# Patient Record
Sex: Female | Born: 1965 | Race: White | Hispanic: No | Marital: Married | Smoking: Former smoker
Health system: Southern US, Community
[De-identification: ages and names within clinical notes are randomized; demographics above are authoritative.]

## PROBLEM LIST (undated history)

## (undated) DIAGNOSIS — M84373A Stress fracture, unspecified ankle, initial encounter for fracture: Secondary | ICD-10-CM

## (undated) DIAGNOSIS — F329 Major depressive disorder, single episode, unspecified: Secondary | ICD-10-CM

## (undated) DIAGNOSIS — K219 Gastro-esophageal reflux disease without esophagitis: Secondary | ICD-10-CM

## (undated) DIAGNOSIS — Z30430 Encounter for insertion of intrauterine contraceptive device: Secondary | ICD-10-CM

## (undated) DIAGNOSIS — J189 Pneumonia, unspecified organism: Secondary | ICD-10-CM

## (undated) DIAGNOSIS — F32A Depression, unspecified: Secondary | ICD-10-CM

## (undated) DIAGNOSIS — T7840XA Allergy, unspecified, initial encounter: Secondary | ICD-10-CM

## (undated) DIAGNOSIS — F419 Anxiety disorder, unspecified: Secondary | ICD-10-CM

## (undated) DIAGNOSIS — E785 Hyperlipidemia, unspecified: Secondary | ICD-10-CM

## (undated) HISTORY — DX: Encounter for insertion of intrauterine contraceptive device: Z30.430

## (undated) HISTORY — DX: Allergy, unspecified, initial encounter: T78.40XA

## (undated) HISTORY — DX: Gastro-esophageal reflux disease without esophagitis: K21.9

## (undated) HISTORY — DX: Anxiety disorder, unspecified: F41.9

## (undated) HISTORY — DX: Hyperlipidemia, unspecified: E78.5

---
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
## 1997-12-10 ENCOUNTER — Inpatient Hospital Stay (HOSPITAL_COMMUNITY): Admission: AD | Admit: 1997-12-10 | Discharge: 1997-12-10 | Payer: Self-pay | Admitting: Obstetrics and Gynecology

## 1998-02-25 ENCOUNTER — Inpatient Hospital Stay (HOSPITAL_COMMUNITY): Admission: AD | Admit: 1998-02-25 | Discharge: 1998-02-28 | Payer: Self-pay | Admitting: *Deleted

## 2000-06-02 ENCOUNTER — Encounter: Payer: Self-pay | Admitting: Emergency Medicine

## 2000-06-02 ENCOUNTER — Emergency Department (HOSPITAL_COMMUNITY): Admission: EM | Admit: 2000-06-02 | Discharge: 2000-06-02 | Payer: Self-pay | Admitting: Emergency Medicine

## 2000-06-15 ENCOUNTER — Encounter: Admission: RE | Admit: 2000-06-15 | Discharge: 2000-06-15 | Payer: Self-pay | Admitting: Orthopedic Surgery

## 2000-06-15 ENCOUNTER — Encounter: Payer: Self-pay | Admitting: Orthopedic Surgery

## 2001-01-12 ENCOUNTER — Encounter (INDEPENDENT_AMBULATORY_CARE_PROVIDER_SITE_OTHER): Payer: Self-pay | Admitting: *Deleted

## 2001-01-12 ENCOUNTER — Encounter: Payer: Self-pay | Admitting: Orthopedic Surgery

## 2001-01-13 ENCOUNTER — Inpatient Hospital Stay (HOSPITAL_COMMUNITY): Admission: RE | Admit: 2001-01-13 | Discharge: 2001-01-14 | Payer: Self-pay | Admitting: Orthopedic Surgery

## 2001-02-04 HISTORY — PX: MICRODISCECTOMY LUMBAR: SUR864

## 2002-04-11 ENCOUNTER — Other Ambulatory Visit: Admission: RE | Admit: 2002-04-11 | Discharge: 2002-04-11 | Payer: Self-pay | Admitting: *Deleted

## 2002-06-06 HISTORY — PX: SPINE SURGERY: SHX786

## 2003-05-13 ENCOUNTER — Other Ambulatory Visit: Admission: RE | Admit: 2003-05-13 | Discharge: 2003-05-13 | Payer: Self-pay | Admitting: *Deleted

## 2007-04-10 ENCOUNTER — Other Ambulatory Visit: Admission: RE | Admit: 2007-04-10 | Discharge: 2007-04-10 | Payer: Self-pay | Admitting: Obstetrics & Gynecology

## 2007-06-15 ENCOUNTER — Encounter: Admission: RE | Admit: 2007-06-15 | Discharge: 2007-06-15 | Payer: Self-pay | Admitting: Obstetrics & Gynecology

## 2008-06-06 HISTORY — PX: CERVICAL DISC ARTHROPLASTY: SHX587

## 2009-07-10 ENCOUNTER — Encounter: Admission: RE | Admit: 2009-07-10 | Discharge: 2009-07-10 | Payer: Self-pay | Admitting: Orthopedic Surgery

## 2009-08-12 ENCOUNTER — Inpatient Hospital Stay (HOSPITAL_COMMUNITY): Admission: RE | Admit: 2009-08-12 | Discharge: 2009-08-13 | Payer: Self-pay | Admitting: Orthopedic Surgery

## 2010-02-19 ENCOUNTER — Encounter: Admission: RE | Admit: 2010-02-19 | Discharge: 2010-02-19 | Payer: Self-pay | Admitting: Orthopedic Surgery

## 2010-05-26 ENCOUNTER — Encounter
Admission: RE | Admit: 2010-05-26 | Discharge: 2010-05-26 | Payer: Self-pay | Source: Home / Self Care | Attending: Obstetrics & Gynecology | Admitting: Obstetrics & Gynecology

## 2010-08-27 LAB — CBC
HCT: 41.9 % (ref 36.0–46.0)
Hemoglobin: 14.7 g/dL (ref 12.0–15.0)
WBC: 10.7 10*3/uL — ABNORMAL HIGH (ref 4.0–10.5)

## 2010-10-22 NOTE — Op Note (Signed)
[place]. [facility]  Patient:    Heather Cochran, Heather Cochran Visit Number: [id number] MRN: [id number]          Service Type: DSU Location: [phone] Attending Physician:  Marlowe Kays Page Proc. Date: 01/12/01 Admit Date:  01/12/2001                             Operative Report  PREOPERATIVE DIAGNOSIS:  Central herniated nucleus pulposus L5-S1.  POSTOPERATIVE DIAGNOSIS:  Central herniated nucleus pulposus L5-S1.  OPERATION:  Bilateral microdiskectomy L5-S1.  SURGEON:  Illene Labrador. [place], M.D.  ASSISTANT:  Georges Lynch. Darrelyn Hillock, M.D.  ANESTHESIA:  General.  INDICATIONS.  She has had at least a five-year history of fairly disabling back pain to the point now where her activities are markedly curtailed.  Plain x-rays demonstrate narrowing of the L5-S1 disk.  MRI demonstrates central disk herniation at L5-S1 with some protrusion over the sacrum.  Treatment options were discussed with her.  Because of the chronic disabling pain, she has elected to be here today for the microdiskectomy.  Risks and complications were discussed at length with her.  DESCRIPTION OF PROCEDURE:  Prophylactic antibiotics, satisfactory general anesthesia, prone position on the [place] frame.  Her back was prepped with DuraPrep.  With three spinal neeedles and a spinal x-ray, I tentatively identified the L5-S1 interspace.  I then completed draping in sterile filed, Ioban employed.  Vertical incision based on the initial x-ray, and the anatomy of L5-S1 space was confirmed with the sacrum identifiable, and I dissected the soft tissue off the lamina of L5 and sacrum bilaterally and placed a self-retaining McCullough retractor.  Working bilaterally, we removed a portion of the inferior part of the lamina of L5, and then with a microcuret, undermined superior portion of the sacrum, and with 2 and 3 mm Kerrison rongeurs, removed ligamentum flavum and bone laterally and did a  partial foraminotomy.  We then brought in the microscope.  Working first on the left side, I completed the decompression with the wider foraminotomy and removing bone laterally, but there was plenty of space for the S1 nerve root which was identified and moved medially.  The L5-S1 disk was noted to be bulging and was opened with a #15 knife blade after cauterizing a number of vessels with bipolar cautery.  The interspace was very narrow as suspected on the MRI.  A large amount of degenerated disk was removed, mainly with micro pituitaries, straight and angled up bite.  Also used Epstein curet and nerve hook, removing the disk material from over the sacrum.  When I thought we had obtained all disk material removable from the interspace, the wound was irrigated well, and I checked to be sure that wide foraminotomy had been performed and that there were no fragments beneath the S1 nerve root and that the L4-5 foramen was patent.  We then moved to the right where a similar procedure was done.  The S1 nerve root on the right was considerably more edematous than on the left, and a lot more disk material was removed from the right side which was in keeping with the MRI.  After we had removed all disk material here, we irrigated the wound well.  I checked to make sure the S1 nerve root was well decompressed in foramen and also foramen at 4-5 was decompressed.  I then placed Gelfoam over the interspace and around the S1 nerve root and dura  and then moved back to the left side once again where I once more entered the interspace, and basically no disk material was obtainable.   I then irrigated and applied Gelfoam as I had on the right.  Self-retaining retractors were removed.  She as given 30 mg of Toradol IV.  There was no unusual bleeding, and the fascia was closed with interrupted #1 Vicryl, subcutaneous tissue with 2-0 Vicryl and infiltrated with 0.5% Marcaine plain.  The skin was then closed  with staples. Betadine, Adaptic, and dry sterile dressing were applied.  She tolerated the procedure well and was returned to recovery in satisfactory condition with no known complications. Attending Physician:  Joaquin Courts DD:  01/12/01 TD:  01/13/01 Job: [id number]

## 2011-06-29 DIAGNOSIS — Z30430 Encounter for insertion of intrauterine contraceptive device: Secondary | ICD-10-CM

## 2011-06-29 HISTORY — DX: Encounter for insertion of intrauterine contraceptive device: Z30.430

## 2011-07-05 HISTORY — PX: INTRAUTERINE DEVICE INSERTION: SHX323

## 2012-06-13 ENCOUNTER — Other Ambulatory Visit: Payer: Self-pay | Admitting: Obstetrics & Gynecology

## 2012-06-13 DIAGNOSIS — Z1231 Encounter for screening mammogram for malignant neoplasm of breast: Secondary | ICD-10-CM

## 2012-06-15 ENCOUNTER — Ambulatory Visit
Admission: RE | Admit: 2012-06-15 | Discharge: 2012-06-15 | Disposition: A | Discharge status: Home / Self Care | Payer: 59 | Source: Ambulatory Visit | Attending: Obstetrics & Gynecology | Admitting: Obstetrics & Gynecology

## 2012-06-15 DIAGNOSIS — Z1231 Encounter for screening mammogram for malignant neoplasm of breast: Secondary | ICD-10-CM

## 2013-07-01 ENCOUNTER — Other Ambulatory Visit: Payer: Self-pay

## 2013-07-01 DIAGNOSIS — Z1231 Encounter for screening mammogram for malignant neoplasm of breast: Secondary | ICD-10-CM

## 2013-07-02 ENCOUNTER — Encounter: Payer: Self-pay | Admitting: Gynecology

## 2013-07-05 ENCOUNTER — Encounter: Payer: Self-pay | Admitting: Gynecology

## 2013-07-05 ENCOUNTER — Ambulatory Visit (INDEPENDENT_AMBULATORY_CARE_PROVIDER_SITE_OTHER): Payer: 59 | Admitting: Gynecology

## 2013-07-05 VITALS — BP 101/60 | HR 75 | Resp 18 | Ht 63.75 in | Wt 195.0 lb

## 2013-07-05 DIAGNOSIS — F329 Major depressive disorder, single episode, unspecified: Secondary | ICD-10-CM | POA: Insufficient documentation

## 2013-07-05 DIAGNOSIS — D259 Leiomyoma of uterus, unspecified: Secondary | ICD-10-CM

## 2013-07-05 DIAGNOSIS — D219 Benign neoplasm of connective and other soft tissue, unspecified: Secondary | ICD-10-CM | POA: Insufficient documentation

## 2013-07-05 DIAGNOSIS — Z0000 Encounter for general adult medical examination without abnormal findings: Secondary | ICD-10-CM

## 2013-07-05 DIAGNOSIS — F32A Depression, unspecified: Secondary | ICD-10-CM | POA: Insufficient documentation

## 2013-07-05 DIAGNOSIS — F3289 Other specified depressive episodes: Secondary | ICD-10-CM

## 2013-07-05 DIAGNOSIS — F411 Generalized anxiety disorder: Secondary | ICD-10-CM | POA: Insufficient documentation

## 2013-07-05 DIAGNOSIS — Z01419 Encounter for gynecological examination (general) (routine) without abnormal findings: Secondary | ICD-10-CM

## 2013-07-05 LAB — POCT URINALYSIS DIPSTICK
Blood, UA: 2
Leukocytes, UA: NEGATIVE
PH UA: 5
UROBILINOGEN UA: NEGATIVE

## 2013-07-05 MED ORDER — VENLAFAXINE HCL ER 37.5 MG PO CP24: 37.5000 mg | ORAL_CAPSULE | Freq: Every day | ORAL | Status: DC

## 2013-07-05 MED ORDER — BUPROPION HCL ER (XL) 300 MG PO TB24: 300.0000 mg | ORAL_TABLET | Freq: Every day | ORAL | Status: DC

## 2013-07-05 MED ORDER — BUPROPION HCL ER (XL) 150 MG PO TB24: 150.0000 mg | ORAL_TABLET | Freq: Every day | ORAL | Status: DC

## 2013-07-05 MED ORDER — VENLAFAXINE HCL ER 75 MG PO CP24: 75.0000 mg | ORAL_CAPSULE | Freq: Every day | ORAL | Status: DC

## 2013-07-05 NOTE — Progress Notes (Signed)
48 y.o. Married Caucasian female   [phone] here for annual exam. Pt is currently sexually active.  Pt was on wellbutrin and effexor for anxiety but her PCP is no long in [place],, so she ran out approx 10m ago.  Pt feels like she needs to be on something.  No hot flashes, no vaginal dryness.  No menses on IUD  Patient's last menstrual period was 11/02/2012.          Sexually active: yes  The current method of family planning is IUD (Mirena).    Exercising: no  The patient does not participate in regular exercise at present. Last pap: 06/06/11 NEG HR HPV Alcohol: 1 Monthly Tobacco: no BSE: no Mammogram: 06/15/12 Bi-Rads 1; scheduled in next few weeks   Urine: Blood 2   Health Maintenance  Topic Date Due  . Tetanus/tdap  06/06/2005  . Influenza Vaccine  01/04/2013  . Pap Smear  06/05/2014    Family History  Problem Relation Age of Onset  . Hypertension Mother   . Depression Father   . Kidney cancer Brother   . Depression Maternal Grandmother   . Colon cancer Maternal Grandmother   . Kidney cancer Maternal Grandmother     There are no active problems to display for this patient.   Past Medical History  Diagnosis Date  . Encounter for insertion of mirena IUD 06/29/11    Past Surgical History  Procedure Laterality Date  . Microdiscectomy lumbar  02/2001    L4-5    Allergies: Tetanus toxoids  Current Outpatient Prescriptions  Medication Sig Dispense Refill  . buPROPion (WELLBUTRIN XL) 300 MG 24 hr tablet Take 300 mg by mouth daily.      . Venlafaxine HCl (EFFEXOR XR PO) Take by mouth.       No current facility-administered medications for this visit.    ROS: Pertinent items are noted in HPI.  Exam:    BP 101/60  Pulse 75  Resp 18  Ht 5' 3.75" (1.619 m)  Wt 195 lb (88.451 kg)  BMI 33.74 kg/m2  LMP 11/02/2012 Weight change: @WEIGHTCHANGE @ Last 3 height recordings:  Ht Readings from Last 3 Encounters:  07/05/13 5' 3.75" (1.619 m)   General appearance: alert,  cooperative and appears stated age Head: Normocephalic, without obvious abnormality, atraumatic Neck: no adenopathy, no carotid bruit, no JVD, supple, symmetrical, trachea midline and thyroid not enlarged, symmetric, no tenderness/mass/nodules Lungs: clear to auscultation bilaterally Breasts: normal appearance, no masses or tenderness Heart: regular rate and rhythm, S1, S2 normal, no murmur, click, rub or gallop Abdomen: soft, non-tender; bowel sounds normal; no masses,  no organomegaly Extremities: extremities normal, atraumatic, no cyanosis or edema Skin: Skin color, texture, turgor normal. No rashes or lesions Lymph nodes: Cervical, supraclavicular, and axillary nodes normal. no inguinal nodes palpated Neurologic: Grossly normal   Pelvic: External genitalia:  no lesions              Urethra: normal appearing urethra with no masses, tenderness or lesions              Bartholins and Skenes: normal                 Vagina: normal appearing vagina with normal color and discharge, no lesions              Cervix: normal appearance and IUD string not seen or palpated              Pap taken: no  Bimanual Exam:  Uterus:  enlarged to 16 week's size, irregular                                      Adnexa:    Not palpable                                      Rectovaginal: Confirms                                      Anus:  normal sphincter tone, no lesions  A: well woman Contraceptive management-iud in place Fibroid uterus-stable     P: mammogram annual PAP not done Refill both effexor and wellbutrin will start at lower dose and bring back to previous level counseled on breast self exam, mammography screening, adequate intake of calcium and vitamin D, diet and exercise return annually or prn   An After Visit Summary was printed and given to the patient.

## 2013-07-05 NOTE — Patient Instructions (Signed)

## 2013-07-12 ENCOUNTER — Ambulatory Visit
Admission: RE | Admit: 2013-07-12 | Discharge: 2013-07-12 | Disposition: A | Discharge status: Home / Self Care | Payer: 59 | Source: Ambulatory Visit

## 2013-07-12 DIAGNOSIS — Z1231 Encounter for screening mammogram for malignant neoplasm of breast: Secondary | ICD-10-CM

## 2014-03-21 ENCOUNTER — Other Ambulatory Visit: Payer: Self-pay

## 2014-04-07 ENCOUNTER — Encounter: Payer: Self-pay | Admitting: Gynecology

## 2014-05-05 ENCOUNTER — Telehealth: Payer: Self-pay | Admitting: Gynecology

## 2014-05-05 NOTE — Telephone Encounter (Signed)
Left message regarding cancelled aex appt.

## 2014-06-09 ENCOUNTER — Other Ambulatory Visit: Payer: Self-pay

## 2014-06-09 DIAGNOSIS — Z1231 Encounter for screening mammogram for malignant neoplasm of breast: Secondary | ICD-10-CM

## 2014-07-07 ENCOUNTER — Ambulatory Visit: Payer: 59 | Admitting: Gynecology

## 2014-07-08 ENCOUNTER — Encounter: Payer: Self-pay | Admitting: Nurse Practitioner

## 2014-07-08 ENCOUNTER — Ambulatory Visit (INDEPENDENT_AMBULATORY_CARE_PROVIDER_SITE_OTHER): Payer: 59 | Admitting: Nurse Practitioner

## 2014-07-08 VITALS — BP 100/62 | HR 80 | Ht 63.75 in | Wt 186.0 lb

## 2014-07-08 DIAGNOSIS — Z0000 Encounter for general adult medical examination without abnormal findings: Secondary | ICD-10-CM

## 2014-07-08 DIAGNOSIS — K6389 Other specified diseases of intestine: Secondary | ICD-10-CM

## 2014-07-08 DIAGNOSIS — Z01419 Encounter for gynecological examination (general) (routine) without abnormal findings: Secondary | ICD-10-CM

## 2014-07-08 DIAGNOSIS — F32A Depression, unspecified: Secondary | ICD-10-CM

## 2014-07-08 DIAGNOSIS — F329 Major depressive disorder, single episode, unspecified: Secondary | ICD-10-CM

## 2014-07-08 DIAGNOSIS — K529 Noninfective gastroenteritis and colitis, unspecified: Secondary | ICD-10-CM

## 2014-07-08 LAB — POCT URINALYSIS DIPSTICK
Bilirubin, UA: NEGATIVE
Blood, UA: NEGATIVE
Glucose, UA: NEGATIVE
Ketones, UA: NEGATIVE
Leukocytes, UA: NEGATIVE
NITRITE UA: NEGATIVE
Protein, UA: NEGATIVE
Urobilinogen, UA: NEGATIVE
pH, UA: 7

## 2014-07-08 MED ORDER — VENLAFAXINE HCL ER 75 MG PO CP24: 75.0000 mg | ORAL_CAPSULE | Freq: Every day | ORAL | Status: DC

## 2014-07-08 MED ORDER — BUPROPION HCL ER (XL) 300 MG PO TB24: 300.0000 mg | ORAL_TABLET | Freq: Every day | ORAL | Status: DC

## 2014-07-08 NOTE — Progress Notes (Addendum)
Patient ID: Heather Cochran, female   DOB: May 10, 1966, 49 y.o.   MRN: [id number] 49 y.o. [id number] Married  Caucasian Fe here for annual exam.  Doing well with Mirena IUD with amenorrhea.  Occ. PMS symptoms. Having symptoms of IBS or other inflammatory GI disease with diarrhea for several months.  Sometimes associated with mucous.  Denies weight loss.  Would like to see GI.  Patient's last menstrual period was 07/05/2011.   Mirena placed 07/05/11.       Sexually active: Yes.    The current method of family planning is IUD (Mirena). Exercising: Yes.    treadmill 2-3 times per week Smoker:  no  Health Maintenance: Pap:  06/06/11, negative with neg HR HPV MMG:  07/12/13, Bi-Rads 1:  Negative apt. on Monday TDaP:  Allergy to tetanus, not a candidate for vaccine Labs:  HB:  13.8   Urine:  Negative    reports that she quit smoking about 22 years ago. She has never used smokeless tobacco. She reports that she drinks alcohol. She reports that she uses illicit drugs.  Past Medical History  Diagnosis Date  . Encounter for insertion of mirena IUD 06/29/11    Past Surgical History  Procedure Laterality Date  . Microdiscectomy lumbar  02/2001    L4-5  . Cervical disc arthroplasty  2010  . Cesarean section  1996  . Intrauterine device insertion  07/05/11    Mirena    Current Outpatient Prescriptions  Medication Sig Dispense Refill  . buPROPion (WELLBUTRIN XL) 300 MG 24 hr tablet Take 1 tablet (300 mg total) by mouth daily. 90 tablet 0  . venlafaxine XR (EFFEXOR XR) 75 MG 24 hr capsule Take 1 capsule (75 mg total) by mouth daily with breakfast. 90 capsule 0   No current facility-administered medications for this visit.    Family History  Problem Relation Age of Onset  . Hypertension Mother   . Depression Father   . Kidney cancer Brother   . Depression Maternal Grandmother   . Colon cancer Maternal Grandmother 69  . Kidney cancer Maternal Grandmother     ROS:  Pertinent items are noted in HPI.   Otherwise, a comprehensive ROS was negative.  Exam:   BP 100/62 mmHg  Pulse 80  Ht 5' 3.75" (1.619 m)  Wt 186 lb (84.369 kg)  BMI 32.19 kg/m2  LMP 07/05/2011 Height: 5' 3.75" (161.9 cm) Ht Readings from Last 3 Encounters:  07/08/14 5' 3.75" (1.619 m)  07/05/13 5' 3.75" (1.619 m)    General appearance: alert, cooperative and appears stated age Head: Normocephalic, without obvious abnormality, atraumatic Neck: no adenopathy, supple, symmetrical, trachea midline and thyroid normal to inspection and palpation Lungs: clear to auscultation bilaterally Breasts: normal appearance, no masses or tenderness Heart: regular rate and rhythm Abdomen: soft, non-tender; no masses,  no organomegaly Extremities: extremities normal, atraumatic, no cyanosis or edema Skin: Skin color, texture, turgor normal. No rashes or lesions Lymph nodes: Cervical, supraclavicular, and axillary nodes normal. No abnormal inguinal nodes palpated Neurologic: Grossly normal   Pelvic: External genitalia:  no lesions              Urethra:  normal appearing urethra with no masses, tenderness or lesions              Bartholin's and Skene's: normal                 Vagina: normal appearing vagina with normal color and discharge, no lesions  Cervix: anteverted.  IUD string was maybe seen at the os but was very short,              Pap taken: Yes.   Bimanual Exam:  Uterus:  enlarged, 12 weeks size              Adnexa: no mass, fullness, tenderness               Rectovaginal: Confirms               Anus:  normal sphincter tone, no lesions  Chaperone present:  no  A:  Well Woman with normal exam  Contraceptive management Mirena IUD 07/05/11  Fibroid uterus-stable  Degenerative disc disease    P:   Reviewed health and wellness pertinent to exam  Pap smear taken today  Mammogram is due and scheduled for Monday  Will get GI consult and follow to R/O inflammatory disease.  Counseled on breast self exam,  mammography screening, adequate intake of calcium and vitamin D, diet and exercise, Kegel's exercises return annually or prn  An After Visit Summary was printed and given to the patient.

## 2014-07-08 NOTE — Patient Instructions (Signed)

## 2014-07-09 LAB — HEMOGLOBIN, FINGERSTICK: Hemoglobin, fingerstick: 13.8 g/dL (ref 12.0–16.0)

## 2014-07-10 LAB — IPS PAP TEST WITH HPV

## 2014-07-13 NOTE — Progress Notes (Signed)
Encounter reviewed by Dr. Josefa Half.  If no recent ultrasound, will do pelvic ultrasound to evaluate uterus and IUD position.

## 2014-07-14 ENCOUNTER — Ambulatory Visit
Admission: RE | Admit: 2014-07-14 | Discharge: 2014-07-14 | Disposition: A | Discharge status: Home / Self Care | Payer: 59 | Source: Ambulatory Visit

## 2014-07-14 ENCOUNTER — Other Ambulatory Visit: Payer: Self-pay

## 2014-07-14 DIAGNOSIS — Z1231 Encounter for screening mammogram for malignant neoplasm of breast: Secondary | ICD-10-CM

## 2014-07-15 ENCOUNTER — Telehealth: Payer: Self-pay | Admitting: Nurse Practitioner

## 2014-07-15 NOTE — Telephone Encounter (Signed)
Left message to call [place] at [phone].  Need to advise patient of appointment scheduled for Feb 25th at 8:30am with Dr.Mann.

## 2014-07-15 NOTE — Telephone Encounter (Signed)
Spoke with patient. Advised patient of appointment with Dr.Mann on Feb 25th at 8:30am. Patient is agreeable to date and time. Provided address and phone number to Dr.Mann's office for reference.  Routing to provider for final review. Patient agreeable to disposition. Will close encounter

## 2014-07-15 NOTE — Telephone Encounter (Signed)
Pt checking on referral to GI doctor.

## 2014-07-17 ENCOUNTER — Telehealth: Payer: Self-pay | Admitting: Emergency Medicine

## 2014-07-17 DIAGNOSIS — D259 Leiomyoma of uterus, unspecified: Secondary | ICD-10-CM

## 2014-07-17 NOTE — Telephone Encounter (Signed)
Heather Cage, FNP  Karen Chafe, RN            Can you please call her and schedule PUS Looks like Dr. Quincy Simmonds wants to check the stability of fibroids.

## 2014-07-17 NOTE — Telephone Encounter (Signed)
Message left to return call to Arkin Imran at [phone].    

## 2014-07-17 NOTE — Telephone Encounter (Deleted)
-----   Message from [name], [place] sent at 07/15/2014  7:19 PM EST ----- Regarding: FW: let's do pelvic ultrasound to check uterus and IUD? Can you please call her and schedule PUS Looks like Dr. Quincy Simmonds wants to check the stability of fibroids. ----- Message -----    From: Odelia Gage, MD    Sent: 07/15/2014   4:51 PM      To: [name], FNP Subject: RE: let's do pelvic ultrasound to check uter#  Hi Patty,   I believe that it was at least 2 years since the patient had an ultrasound.    It is really reasonable to repeat the study.   Thank you!  Brook ----- Message -----    From: [name], FNP    Sent: 07/14/2014   8:39 AM      To: Brook E Amundson de Berton Lan, MD Subject: RE: let's do pelvic ultrasound to check uter#  Last PUS was done 3/13 by Dr. Charlies Constable and says IUD is seen centrally within the endometrial cavity and a large fundal fibroid is seen.  Pt. Is aware that we are having a hard time finding the strings - should I order another PUS?  Paper chart on your desk. ----- Message -----    From: Odelia Gage, MD    Sent: 07/13/2014  10:14 AM      To: [name], FNP Subject: let's do pelvic ultrasound to check uterus a#  Hi Patty,   I did not see a recent pelvic ultrasound on this patient or documentation about her IUD strings and position.  Note from Dr. Charlies Constable last year said the IUD strings were not visible.    Let's get a pelvic ultrasound to evaluate for uterine fibroids and IUD position?  Thanks!  Brook

## 2014-07-17 NOTE — Telephone Encounter (Signed)
Spoke with patient and message from [name], [place] discussed.Patient agreeable to appointment for ultrasound.   Scheduled for 08/21/14 due to patient work schedule. She would like to know out of pocket costs prior to confirming appointment. Advised patient would be contacted by insurance co-ordinator to discuss. Patient agreeable.   Sabrina, I have scheduled procedures. Can you call with precert?.   Will close. Routing to Dr. Quincy Simmonds and [name], FNP

## 2014-07-18 NOTE — Telephone Encounter (Signed)
Left message for patient to call back. Need to go over benefits for upcoming procedure.

## 2014-07-21 NOTE — Telephone Encounter (Signed)
Please keep in the work que until the patient is reached.   [place]

## 2014-07-22 NOTE — Telephone Encounter (Signed)
Patient has been reached.

## 2014-07-22 NOTE — Telephone Encounter (Signed)
Left message for patient to call back  

## 2014-07-22 NOTE — Telephone Encounter (Signed)
Telephone encounter documented in the account notes.

## 2014-08-15 ENCOUNTER — Telehealth: Payer: Self-pay | Admitting: Obstetrics and Gynecology

## 2014-08-15 NOTE — Telephone Encounter (Signed)
Patient called and cancelled her ultrasound appointment for 08/21/14 with Dr. Quincy Simmonds. She said, "There are other tests I have to have right now first." She will call back to reschedule at a later date.  Cc: Gay Filler for [employer]

## 2014-08-21 ENCOUNTER — Other Ambulatory Visit: Payer: 59

## 2014-08-21 ENCOUNTER — Other Ambulatory Visit: Payer: 59 | Admitting: Obstetrics and Gynecology

## 2014-08-26 NOTE — Telephone Encounter (Signed)
Follow-up call to patient. Left message to call back. Patient with known history of fibroids. Last AEX 07-08-14 with Edman Circle FNP 12 week size uterus. Previous AEX 07-05-13 with Dr Charlies Constable, 16 week size uterus.  Has IUD in place since 06/2011 and string visible, although short. Patient has amenorrhea with IUD in place and no other issues with fibroids.  Was referred to Dr Collene Mares for IBS issues by Chong Sicilian at most recent AEX.  Please advise on how long can delay PUS?

## 2014-08-27 NOTE — Telephone Encounter (Signed)
The reason for the pelvic ultrasound is to check the position of the IUD.  The strings were not seen vaginally with confidence at the patient's last office visit.  Without the ultrasound, we cannot tell the patient if the IUD is in the correct location and working well to give her contraception if this is needed. I do understand, however, that the patient is amenorrheic from her last office visit note.  We are just trying to help and at the same time can check the uterine fibroids.

## 2014-09-19 ENCOUNTER — Other Ambulatory Visit: Payer: Self-pay | Admitting: Nurse Practitioner

## 2014-09-19 NOTE — Telephone Encounter (Signed)
Please have Edman Circle review the plan for this patient for prescribing these medications.

## 2014-09-19 NOTE — Telephone Encounter (Signed)
Routed to PG 

## 2014-09-19 NOTE — Telephone Encounter (Signed)
Medication refill request: Wellbutrin and Effexor  Last AEX:  07/08/14 PG Next AEX: 07/10/15 PG Last MMG (if hormonal medication request): 07/14/14 [id number]:Neg Refill authorized: both 07/08/14 #[id number]. Today Please advise.   Routed to Dr. Quincy Simmonds

## 2014-09-22 NOTE — Telephone Encounter (Signed)
Left message to call Airi Copado at [phone]. 

## 2014-09-22 NOTE — Telephone Encounter (Signed)
Heather Cochran,  Please call this pt. About her Wellbutrin and Effexor.  She was previously on this med prior to Dr. Charlies Constable seeing her last year and PCP had moved v]from [facility].  She was given a refill then.  At AEX this year I saw her but did not give her a refill, assuming PCP was doing this.  Now that she has called for a refill she needs to know that we are no longer going to give this RX and has to be given through PCP.  A refill was sent by Dr. Zane Herald 1 only.  Will need to establish care or talk to new PCP if she has one about these me'ds.

## 2014-09-23 NOTE — Telephone Encounter (Signed)
Message left to return call to Heather Cochran at [phone].    

## 2014-09-30 NOTE — Telephone Encounter (Signed)
Left message to call Heather Cochran at [phone]. 

## 2014-09-30 NOTE — Telephone Encounter (Signed)
Follow up call to patient, left message to call back. 

## 2014-10-07 NOTE — Telephone Encounter (Signed)
Follow-up call to patient. Left message to call back, ask for triage nurse.

## 2014-10-21 NOTE — Telephone Encounter (Signed)
Left message to call Jacquelyne Quarry at [phone]. 

## 2014-10-23 NOTE — Telephone Encounter (Signed)
Call to patient. Mail box full, unable to leave message on cell number.  Left message to call back on home number. Patient is active on MyChart. Will send My Chart message. This is listed on DPR.   Routing to provider for final review. Patient agreeable to disposition. Will close encounter.

## 2014-10-31 LAB — HM COLONOSCOPY

## 2014-11-06 ENCOUNTER — Telehealth: Payer: Self-pay | Admitting: *Deleted

## 2014-11-06 NOTE — Telephone Encounter (Signed)
See previous phone messages. Multiple attempts to reach patient by phone and My Chart message was sent to follow up on scheduling recommended PUS. PUS was ordered by Dr Quincy Simmonds on 07-13-14 upon review of 07-08-14 OV with Edman Circle. Patient has not returned call and MyChart message was received that it was never read. Any further follow up needed?

## 2014-11-06 NOTE — Telephone Encounter (Signed)
I have been unable to reach this patient x3 regarding her medication refill. Lamont Snowball, RN has also attempted to reach this patient to discuss her care without return call. Mychart message also been sent that was not read by patient. Okay to close encounter?

## 2014-11-06 NOTE — Telephone Encounter (Signed)
I recommend sending a letter to the patient.  Triage has prepared a letter that would be appropriate to mail to her.   Thank you.

## 2014-11-10 ENCOUNTER — Encounter: Payer: Self-pay | Admitting: *Deleted

## 2014-11-10 NOTE — Telephone Encounter (Signed)
Letter signed by me and placed on your desk.

## 2014-11-10 NOTE — Telephone Encounter (Signed)
Letter to your office for review. 

## 2014-11-11 NOTE — Telephone Encounter (Signed)
Letter mailed certified and regular [place] mail. Encounter closed. Hollace Hayward.

## 2014-11-20 ENCOUNTER — Telehealth: Payer: Self-pay | Admitting: *Deleted

## 2014-11-20 NOTE — Telephone Encounter (Signed)
Dr Quincy Simmonds ordered PUS to assess IUD due to shorter strings seen by Heather Cochran at AEX on 07-08-14. fLetter mailed regarding recommendations for pelvic ultrasound on 11-10-14. No patient response.  Can we complete order until patient calls?

## 2014-11-20 NOTE — Telephone Encounter (Signed)
Yes.  Agree.  Order removed with explanation placed.

## 2014-11-20 NOTE — Telephone Encounter (Signed)
Encounter closed

## 2015-06-19 ENCOUNTER — Other Ambulatory Visit: Payer: Self-pay

## 2015-06-19 DIAGNOSIS — Z1231 Encounter for screening mammogram for malignant neoplasm of breast: Secondary | ICD-10-CM

## 2015-07-09 ENCOUNTER — Telehealth: Payer: Self-pay | Admitting: Nurse Practitioner

## 2015-07-09 NOTE — Telephone Encounter (Signed)
LEFT MESSAGE FOR APPT/RD

## 2015-07-10 ENCOUNTER — Encounter: Payer: Self-pay | Admitting: Nurse Practitioner

## 2015-07-10 ENCOUNTER — Ambulatory Visit (INDEPENDENT_AMBULATORY_CARE_PROVIDER_SITE_OTHER): Payer: 59 | Admitting: Nurse Practitioner

## 2015-07-10 VITALS — BP 104/66 | HR 60 | Resp 16 | Ht 63.75 in | Wt 144.0 lb

## 2015-07-10 DIAGNOSIS — Z01419 Encounter for gynecological examination (general) (routine) without abnormal findings: Secondary | ICD-10-CM | POA: Diagnosis not present

## 2015-07-10 DIAGNOSIS — Z0000 Encounter for general adult medical examination without abnormal findings: Secondary | ICD-10-CM | POA: Diagnosis not present

## 2015-07-10 LAB — POCT URINALYSIS DIPSTICK
BILIRUBIN UA: NEGATIVE
Blood, UA: NEGATIVE
GLUCOSE UA: NEGATIVE
KETONES UA: NEGATIVE
LEUKOCYTES UA: NEGATIVE
Nitrite, UA: NEGATIVE
PH UA: 7
Protein, UA: NEGATIVE
Urobilinogen, UA: NEGATIVE

## 2015-07-10 MED ORDER — BUPROPION HCL ER (XL) 300 MG PO TB24: 300.0000 mg | ORAL_TABLET | Freq: Every day | ORAL | Status: DC

## 2015-07-10 MED ORDER — VENLAFAXINE HCL ER 75 MG PO CP24: ORAL_CAPSULE | ORAL | Status: DC

## 2015-07-10 NOTE — Progress Notes (Signed)
50 y.o. [id number] Married  Caucasian Fe here for annual exam.  No menses on IUD needs changing Mirena IUD with Dr. Sabra Heck 06/2016.  She will call sometime in December to schedule.  She feels well without other  Health issues.  Patient's last menstrual period was 07/05/2011.          Sexually active: Yes.    The current method of family planning is IUD.    Exercising: Yes.    Walking Smoker:  no  Health Maintenance: Pap:  07/08/14 Neg HR HPV neg MMG: 07/14/14 BiRADS Category 1 Negative Colonoscopy: 04/16 Normal, 10 yr repeat TDaP:  Patient notes last tetanus was 1997 and had a reaction. HIV: will do today Labs: Hgb 14.3  UA Neg Ph 7.0   reports that she quit smoking about 23 years ago. She has never used smokeless tobacco. She reports that she drinks alcohol. She reports that she uses illicit drugs.  Past Medical History  Diagnosis Date  . Encounter for insertion of mirena IUD 06/29/11    Past Surgical History  Procedure Laterality Date  . Microdiscectomy lumbar  02/2001    L4-5  . Cervical disc arthroplasty  2010  . Cesarean section  1996  . Intrauterine device insertion  07/05/11    Mirena    Current Outpatient Prescriptions  Medication Sig Dispense Refill  . buPROPion (WELLBUTRIN XL) 300 MG 24 hr tablet TAKE 1 TABLET DAILY 30 tablet 0  . venlafaxine XR (EFFEXOR-XR) 75 MG 24 hr capsule TAKE 1 CAPSULE DAILY WITH BREAKFAST 30 capsule 0   No current facility-administered medications for this visit.    Family History  Problem Relation Age of Onset  . Hypertension Mother   . Depression Father   . Kidney cancer Brother   . Depression Maternal Grandmother   . Melanoma Maternal Grandmother 80    mets to colon  . Kidney cancer Maternal Grandmother     ROS:  Pertinent items are noted in HPI.  Otherwise, a comprehensive ROS was negative.  Exam:   BP 104/66 mmHg  Pulse 60  Resp 16  Ht 5' 3.75" (1.619 m)  Wt 144 lb (65.318 kg)  BMI 24.92 kg/m2  LMP 07/05/2011 Height: 5'  3.75" (161.9 cm) Ht Readings from Last 3 Encounters:  07/10/15 5' 3.75" (1.619 m)  07/08/14 5' 3.75" (1.619 m)  07/05/13 5' 3.75" (1.619 m)    General appearance: alert, cooperative and appears stated age Head: Normocephalic, without obvious abnormality, atraumatic Neck: no adenopathy, supple, symmetrical, trachea midline and thyroid normal to inspection and palpation Lungs: clear to auscultation bilaterally Breasts: normal appearance, no masses or tenderness Heart: regular rate and rhythm Abdomen: soft, non-tender; no masses,  no organomegaly Extremities: extremities normal, atraumatic, no cyanosis or edema Skin: Skin color, texture, turgor normal. No rashes or lesions Lymph nodes: Cervical, supraclavicular, and axillary nodes normal. No abnormal inguinal nodes palpated Neurologic: Grossly normal   Pelvic: External genitalia:  no lesions              Urethra:  normal appearing urethra with no masses, tenderness or lesions              Bartholin's and Skene's: normal                 Vagina: normal appearing vagina with normal color and discharge, no lesions              Cervix: anteverted IUD strings are visible  Pap taken: No. Bimanual Exam:  Uterus:  normal size, contour, position, consistency, mobility, non-tender and enlarged, 6-8 weeks size              Adnexa: no mass, fullness, tenderness               Rectovaginal: Confirms               Anus:  normal sphincter tone, no lesions  Chaperone present: no  A:  Well Woman with normal exam  Contraceptive management Mirena IUD 07/05/11 Fibroid uterus-stable Degenerative disc disease   P:   Reviewed health and wellness pertinent to exam  Pap smear as above  Mammogram is due now and will schedule  Will call for IUD change  Refill on Wellbutrin XL 300 mg daily and Effexor XR 75 mg daily for a year  Counseled on breast self exam, mammography screening, adequate intake of calcium  and vitamin D, diet and exercise, Kegel's exercises return annually or prn  An After Visit Summary was printed and given to the patient.

## 2015-07-10 NOTE — Patient Instructions (Signed)

## 2015-07-11 LAB — HIV ANTIBODY (ROUTINE TESTING W REFLEX): HIV: NONREACTIVE

## 2015-07-11 NOTE — Progress Notes (Signed)
Encounter reviewed by Dr. Brook Amundson C. Silva.  

## 2015-07-13 LAB — HEMOGLOBIN, FINGERSTICK: HEMOGLOBIN, FINGERSTICK: 14.3 g/dL (ref 12.0–16.0)

## 2015-07-20 ENCOUNTER — Ambulatory Visit
Admission: RE | Admit: 2015-07-20 | Discharge: 2015-07-20 | Disposition: A | Discharge status: Home / Self Care | Payer: 59 | Source: Ambulatory Visit

## 2015-07-20 DIAGNOSIS — Z1231 Encounter for screening mammogram for malignant neoplasm of breast: Secondary | ICD-10-CM

## 2015-08-17 ENCOUNTER — Inpatient Hospital Stay: Admission: RE | Admit: 2015-08-17 | Payer: Self-pay | Source: Ambulatory Visit

## 2016-04-25 ENCOUNTER — Telehealth: Payer: Self-pay | Admitting: Obstetrics & Gynecology

## 2016-04-25 DIAGNOSIS — Z30430 Encounter for insertion of intrauterine contraceptive device: Secondary | ICD-10-CM

## 2016-04-25 DIAGNOSIS — Z30432 Encounter for removal of intrauterine contraceptive device: Secondary | ICD-10-CM

## 2016-04-25 NOTE — Telephone Encounter (Signed)
Sending this to [place].  Can you verify benefits for pt to make sure she shouldn't do this before the end of the year.  Thanks.

## 2016-04-25 NOTE — Telephone Encounter (Signed)
Spoke with patient. Patient request appt for Mirena IUD removal and reinsertion. Mirena placed 07/05/11. Patient scheduled with Dr. Sabra Heck 06/14/15 at 3:30pm. Advised Motrin 800 mg with food and water one hour before procedure. Patient verbalizes understanding and is agreeable to date and time.  Orders placed for IUD removal and insertion.  Routing to provider for final review. Patient is agreeable to disposition. Will close encounter.  Cc: Kem Boroughs, NP; Theresia Lo

## 2016-04-25 NOTE — Telephone Encounter (Signed)
Patient says she needs to have her IUD replaced.

## 2016-04-27 ENCOUNTER — Telehealth: Payer: Self-pay | Admitting: Obstetrics & Gynecology

## 2016-04-27 NOTE — Telephone Encounter (Signed)
Spoke with patient regarding 2017 benefits for IUD removal and reinsertion. Patient understands current plan year benefits and is agreeable. Patient has elected to reschedule appointment to 05/20/16 with Dr Sabra Heck. Patient is aware of date, arrival time and cancellation policy. Patient has no further questions.  Routing to Dr Sabra Heck  cc: Glorianne Manchester

## 2016-04-27 NOTE — Telephone Encounter (Signed)
See telephone encounter dated 04/27/16 for follow-up.

## 2016-05-10 ENCOUNTER — Telehealth: Payer: Self-pay | Admitting: Obstetrics & Gynecology

## 2016-05-10 NOTE — Telephone Encounter (Signed)
Patient is scheduled for IUD Removal and reinsertion on 05/20/16.

## 2016-05-20 ENCOUNTER — Encounter: Payer: Self-pay | Admitting: Obstetrics & Gynecology

## 2016-05-20 ENCOUNTER — Ambulatory Visit (INDEPENDENT_AMBULATORY_CARE_PROVIDER_SITE_OTHER): Payer: 59 | Admitting: Obstetrics & Gynecology

## 2016-05-20 DIAGNOSIS — Z30430 Encounter for insertion of intrauterine contraceptive device: Secondary | ICD-10-CM | POA: Diagnosis not present

## 2016-05-20 DIAGNOSIS — Z30432 Encounter for removal of intrauterine contraceptive device: Secondary | ICD-10-CM

## 2016-05-20 MED ORDER — MISOPROSTOL 200 MCG PO TABS: ORAL_TABLET | ORAL | 0 refills | Status: DC

## 2016-05-21 NOTE — Progress Notes (Signed)
50 y.o. G39P2012 Married Caucasian female presents for removal of Mirena IUD and re-insertion of IUD.  Pt with hx of menorrhagia due to fibroid uterus.  She has been amenorrheic with the Mirena IUD and very happy with this.  As she is 50 without any evidence of peri-menopause from symptoms, pt is planning on having another Mirena IUD placed.  Pt's string has not been visible since after placement of Mirena IUD.  She did have a PUS confirming location but has not had one in several years.    Consent obtained.  Questions answered.  All questions answered prior to start of procedure.    LMP:  No LMP recorded. Patient is not currently having periods (Reason: IUD).  Patient Active Problem List   Diagnosis Date Noted  . Depression 07/05/2013  . Anxiety state, unspecified 07/05/2013  . Fibroid 07/05/2013   Past Medical History:  Diagnosis Date  . Encounter for insertion of mirena IUD 06/29/11   Current Outpatient Prescriptions on File Prior to Visit  Medication Sig Dispense Refill  . buPROPion (WELLBUTRIN XL) 300 MG 24 hr tablet Take 1 tablet (300 mg total) by mouth daily. 90 tablet 3  . venlafaxine XR (EFFEXOR-XR) 75 MG 24 hr capsule TAKE 1 CAPSULE DAILY WITH BREAKFAST 90 capsule 3   No current facility-administered medications on file prior to visit.    Tetanus toxoids  Review of Systems  All other systems reviewed and are negative.    Vitals:   05/20/16 1544  BP: 120/70  Pulse: 82  Resp: 14  Weight: 171 lb 3.2 oz (77.7 kg)  Height: 5\' 4"  (1.626 m)   Gen:  WNWF healthy female NAD Abdomen: soft, non-tender Groin:  no inguinal nodes palpated  Pelvic exam: Vulva:  normal female genitalia Vagina:  normal vagina Cervix:  Non-tender, Negative CMT, no lesions or redness, IUD strings not noted. Uterus:  Enlarged about 10 cm, mobile   Procedure:  Speculum reinserted.  Cervix visualized and cleansed with Betadine x 3.  Attempts made to grasp string and/or IUD with curette.  This was  unsuccessful.  As I am unsure I exact location of IUD within endometrium and I do not have ultrasound available today to give guidance, will reschedule pt to a day when ultrasound is available.  Pt is comfortable with plan.    Procedure ended.  Tenaculum removed.  Minimal spotting noted.  Speculum removed.    A: Removal of Attempted removed of Mirena IUD, unsuccessful Non-visualized IUD Uterine fibroids Amenorrhea with IUD  P:  Return next week for ultrasound guidance of IUD removal and replacement. Pt will use cytotec 292mcg pv, am and pm before procedure.  Rx to pharmacy.

## 2016-05-23 ENCOUNTER — Telehealth: Payer: Self-pay | Admitting: Obstetrics & Gynecology

## 2016-05-23 ENCOUNTER — Other Ambulatory Visit: Payer: Self-pay | Admitting: Obstetrics & Gynecology

## 2016-05-23 DIAGNOSIS — Z30433 Encounter for removal and reinsertion of intrauterine contraceptive device: Secondary | ICD-10-CM

## 2016-05-23 NOTE — Telephone Encounter (Signed)
Called patient to review benefits for a recommended procedure. Left Voicemail requesting a call back. °

## 2016-05-24 ENCOUNTER — Ambulatory Visit: Payer: 59

## 2016-05-24 ENCOUNTER — Ambulatory Visit (INDEPENDENT_AMBULATORY_CARE_PROVIDER_SITE_OTHER): Payer: 59 | Admitting: Obstetrics & Gynecology

## 2016-05-24 ENCOUNTER — Other Ambulatory Visit: Payer: Self-pay | Admitting: *Deleted

## 2016-05-24 DIAGNOSIS — D250 Submucous leiomyoma of uterus: Secondary | ICD-10-CM

## 2016-05-24 DIAGNOSIS — Z30432 Encounter for removal of intrauterine contraceptive device: Secondary | ICD-10-CM

## 2016-05-24 DIAGNOSIS — Z539 Procedure and treatment not carried out, unspecified reason: Secondary | ICD-10-CM

## 2016-05-24 DIAGNOSIS — D251 Intramural leiomyoma of uterus: Secondary | ICD-10-CM | POA: Diagnosis not present

## 2016-05-24 MED ORDER — DOXYCYCLINE HYCLATE 100 MG PO CAPS: 100.0000 mg | ORAL_CAPSULE | Freq: Two times a day (BID) | ORAL | 0 refills | Status: DC

## 2016-05-24 NOTE — Telephone Encounter (Signed)
Spoke with patient regarding benefit for ultrasound guided IUD removal/reinsertion. Patient understood and agreeable. Patient is scheduled today, 05/24/16  with Dr Sabra Heck. Patient aware of arrival date and time.  No further questions. Ok to close

## 2016-05-27 ENCOUNTER — Encounter: Payer: Self-pay | Admitting: Obstetrics & Gynecology

## 2016-05-27 NOTE — Progress Notes (Addendum)
Patient ID: Heather Cochran, female   DOB: 06-11-1965, 50 y.o.   MRN: [id number]  50 y.o. G34P2012 Married Caucasian female presents for removal of Mirena IUD and re-insertion of IUD.  She has hx of fibroids and menorrhagia.  Attempt was made at removal last week but was unsuccessful due to inability to see IUD string.  Today, attempt will be made with ultrasound guidance.  Procedure, risks and benefits have been reviewed.  She is here for procedure today.  Consent is obtained today.  All questions answered prior to start of procedure.    LMP:  No LMP recorded. Patient is not currently having periods (Reason: IUD).  Patient Active Problem List   Diagnosis Date Noted  . Depression 07/05/2013  . Anxiety state, unspecified 07/05/2013  . Fibroid 07/05/2013   Past Medical History:  Diagnosis Date  . Encounter for insertion of mirena IUD 06/29/11   Current Outpatient Prescriptions on File Prior to Visit  Medication Sig Dispense Refill  . buPROPion (WELLBUTRIN XL) 300 MG 24 hr tablet Take 1 tablet (300 mg total) by mouth daily. 90 tablet 3  . levonorgestrel (MIRENA) 20 MCG/24HR IUD 1 each by Intrauterine route once.    . misoprostol (CYTOTEC) 200 MCG tablet 1 tab pv pm and am before procedure 2 tablet 0  . venlafaxine XR (EFFEXOR-XR) 75 MG 24 hr capsule TAKE 1 CAPSULE DAILY WITH BREAKFAST 90 capsule 3   No current facility-administered medications on file prior to visit.    Tetanus toxoids  Review of Systems  All other systems reviewed and are negative.    Gen:  WNWF healthy female NAD Abdomen: soft, non-tender Groin:  no inguinal nodes palpated  Pelvic exam: Vulva:  normal female genitalia Vagina:  normal vagina Cervix:  Non-tender, Negative CMT, no lesions or redness. Uterus:  retroflexed and about 12 weeks in size   Procedure:  Speculum reinserted.  Cervix visualized and cleansed with Betadine x 3.  Paracervical block was not placed.  Single toothed tenaculum applied to anterior lip of  cervix without difficulty.  With ultrasound probe on abdomen, I could visualize the endometrial canal and location of IUD.  Milex dilator needed to dilate cervix.  Curette could be passed into endometrial cavity and was rotated but I could not get the curette to hold onto the IUD.  After several attempts, pt became hypotensive, diaphoretic, and nauseated.  Perforation did not occur however due to pt's reaction with instruments within endometrial cavity, procedure was ended.  Tenaculum removed from cervix with minimal bleeding and speculum removed.  Pt was then recovered in the procedure room in mild Trendelenburg positioning with cold clothes.  BP was 110/70.   Symptoms improved significantly once instruments were removed from vagina.  Will plan hysteroscopy with IUD removal and replacement of new IUD.  May need ultrasound guidance in OR as well.  This will be scheduled for pt.  She will return for discussion of procedure and risks and benefits as she was ready to leave when procedure ended.    A:  Inability to remove Mirena IUD due to inability to see IUD and size of endometrial cavity. H/O menorrhagia Fundal fibroid  P:   Hysteroscopy with IUD removal and replacement of new IUD will be planned in OR.  Doxycycline 100mg  bid x 3 days to pharmacy

## 2016-06-13 ENCOUNTER — Telehealth: Payer: Self-pay | Admitting: Obstetrics & Gynecology

## 2016-06-13 ENCOUNTER — Ambulatory Visit: Payer: Self-pay | Admitting: Obstetrics & Gynecology

## 2016-06-13 NOTE — Telephone Encounter (Signed)
Patient was last seen in office by Dr. Sabra Heck 05/24/16 -plan as seen below, routing to provider for review and advise?  P:         Hysteroscopy with IUD removal and replacement of new IUD will be planned in OR.      Doxycycline 100mg  bid x 3 days to pharmacy

## 2016-06-13 NOTE — Telephone Encounter (Signed)
Patient left message during lunch that she needs to schedule IUD removal with Dr Sabra Heck.

## 2016-06-13 NOTE — Telephone Encounter (Signed)
Return call to patient. Patient prefers surgery on Monday, Tuesday or Friday. Advised will begin precert and call patient back.

## 2016-06-14 ENCOUNTER — Other Ambulatory Visit: Payer: Self-pay | Admitting: Family Medicine

## 2016-06-14 DIAGNOSIS — Z1231 Encounter for screening mammogram for malignant neoplasm of breast: Secondary | ICD-10-CM

## 2016-06-17 NOTE — Telephone Encounter (Signed)
Call to patient. Advised hysteroscopic removal of IUD with reinsertion under ultrasound guidance scheduled for 07-04-16 at 20 at [facility]. Surgery instruction sheet reviewed and printed copy will be mailed.  Advised Dr Sabra Heck will review and we will call her back if any additional instructions prior to procedure.  Routing to provider for final review. Patient agreeable to disposition. Will close encounter.

## 2016-06-17 NOTE — Telephone Encounter (Signed)
Case request for 07-04-16 sent to central scheduling.

## 2016-06-21 ENCOUNTER — Telehealth: Payer: Self-pay | Admitting: *Deleted

## 2016-06-21 ENCOUNTER — Telehealth: Payer: Self-pay | Admitting: Obstetrics & Gynecology

## 2016-06-21 ENCOUNTER — Encounter (HOSPITAL_COMMUNITY): Payer: Self-pay | Admitting: *Deleted

## 2016-06-21 MED ORDER — MISOPROSTOL 200 MCG PO TABS: ORAL_TABLET | ORAL | 0 refills | Status: DC

## 2016-06-21 MED FILL — MIRENA SYSTEM: 20 | 1 days supply | Qty: 1 | Fill #0

## 2016-06-21 NOTE — Telephone Encounter (Signed)
[facility] called and left a message at lunch on our answering machine to let Gay Filler know the Mirena prescription went through at no charge.  They'd like to know whether the patient will pick it up or whether they can send it to our office.

## 2016-06-21 NOTE — Telephone Encounter (Signed)
Message from [facility] out patient pharmacy and Mirena IUD has been dispensed. Will be sent to our office for patient.   Call to patient and notified of IUD status being sent from [facility] out patient pharmacy.   Routing to provider for final review. Patient agreeable to disposition. Will close encounter.

## 2016-06-21 NOTE — Telephone Encounter (Signed)
Call to patient. Advised of Dr Ammie Ferrier recommendation for Cytotec prior to surgery. Also updated on IUD through speciality pharmacy which has been unsuccessful so far. This is now being sent to [facility] out patient pharmacy and she may be receiving call from them.   Routing to provider for final review. Patient agreeable to disposition. Will close encounter.

## 2016-06-24 ENCOUNTER — Other Ambulatory Visit: Payer: Self-pay | Admitting: Obstetrics & Gynecology

## 2016-06-27 ENCOUNTER — Encounter (HOSPITAL_COMMUNITY): Payer: Self-pay | Admitting: *Deleted

## 2016-06-28 ENCOUNTER — Telehealth: Payer: Self-pay | Admitting: *Deleted

## 2016-06-28 MED ORDER — MISOPROSTOL 200 MCG PO TABS: ORAL_TABLET | ORAL | 0 refills | Status: DC

## 2016-06-28 NOTE — Telephone Encounter (Signed)
Return call to patient. Updates provided. Appointment for annual moved to 08-08-16 with post-op appointment. Patient appreciative.   Routing to provider for final review. Patient agreeable to disposition. Will close encounter.

## 2016-06-28 NOTE — Telephone Encounter (Signed)
Patient returned call

## 2016-06-28 NOTE — Telephone Encounter (Signed)
Call to patient to provide update prior to surgery. IUD has been received here in office for surgery. Patient can combine scheduled annual exam (07-11-17 with post op on 08-08-16) if desired. Left message to call back.

## 2016-06-28 NOTE — Telephone Encounter (Signed)
Patient calling in regard to medication for surgery. Went to pick up medication which is not available. Per Epic, medication was sent on 06-21-16 to mail order pharmacy. Rx changed to local pharmacy as requested.  Routing to provider for final review. Patient agreeable to disposition. Will close encounter.

## 2016-07-04 ENCOUNTER — Ambulatory Visit (HOSPITAL_COMMUNITY): Payer: 59 | Admitting: Anesthesiology

## 2016-07-04 ENCOUNTER — Encounter (HOSPITAL_COMMUNITY)
Admission: RE | Disposition: A | Discharge status: Home / Self Care | Payer: Self-pay | Source: Ambulatory Visit | Attending: Obstetrics & Gynecology

## 2016-07-04 ENCOUNTER — Encounter (HOSPITAL_COMMUNITY): Payer: Self-pay

## 2016-07-04 ENCOUNTER — Ambulatory Visit (HOSPITAL_COMMUNITY)
Admission: RE | Admit: 2016-07-04 | Discharge: 2016-07-04 | Disposition: A | Discharge status: Home / Self Care | Payer: 59 | Source: Ambulatory Visit | Attending: Obstetrics & Gynecology | Admitting: Obstetrics & Gynecology

## 2016-07-04 ENCOUNTER — Ambulatory Visit (HOSPITAL_COMMUNITY): Payer: 59

## 2016-07-04 DIAGNOSIS — Z87891 Personal history of nicotine dependence: Secondary | ICD-10-CM | POA: Insufficient documentation

## 2016-07-04 DIAGNOSIS — Z79899 Other long term (current) drug therapy: Secondary | ICD-10-CM | POA: Diagnosis not present

## 2016-07-04 DIAGNOSIS — Z975 Presence of (intrauterine) contraceptive device: Secondary | ICD-10-CM

## 2016-07-04 DIAGNOSIS — Z30430 Encounter for insertion of intrauterine contraceptive device: Secondary | ICD-10-CM

## 2016-07-04 DIAGNOSIS — F329 Major depressive disorder, single episode, unspecified: Secondary | ICD-10-CM | POA: Insufficient documentation

## 2016-07-04 DIAGNOSIS — T8332XA Displacement of intrauterine contraceptive device, initial encounter: Secondary | ICD-10-CM | POA: Diagnosis not present

## 2016-07-04 DIAGNOSIS — N920 Excessive and frequent menstruation with regular cycle: Secondary | ICD-10-CM | POA: Insufficient documentation

## 2016-07-04 DIAGNOSIS — D251 Intramural leiomyoma of uterus: Secondary | ICD-10-CM

## 2016-07-04 DIAGNOSIS — Z30432 Encounter for removal of intrauterine contraceptive device: Secondary | ICD-10-CM | POA: Insufficient documentation

## 2016-07-04 DIAGNOSIS — D259 Leiomyoma of uterus, unspecified: Secondary | ICD-10-CM | POA: Insufficient documentation

## 2016-07-04 HISTORY — DX: Major depressive disorder, single episode, unspecified: F32.9

## 2016-07-04 HISTORY — DX: Pneumonia, unspecified organism: J18.9

## 2016-07-04 HISTORY — PX: INTRAUTERINE DEVICE (IUD) INSERTION: SHX5877

## 2016-07-04 HISTORY — DX: Depression, unspecified: F32.A

## 2016-07-04 HISTORY — PX: HYSTEROSCOPY: SHX211

## 2016-07-04 LAB — PREGNANCY, URINE: Preg Test, Ur: NEGATIVE

## 2016-07-04 SURGERY — HYSTEROSCOPY
Anesthesia: General | Site: Vagina

## 2016-07-04 MED ORDER — MIDAZOLAM HCL 2 MG/2ML IJ SOLN
INTRAMUSCULAR | Status: AC
  Filled 2016-07-04: qty 2

## 2016-07-04 MED ORDER — FENTANYL CITRATE (PF) 100 MCG/2ML IJ SOLN
INTRAMUSCULAR | Status: AC
  Filled 2016-07-04: qty 2

## 2016-07-04 MED ORDER — MIDAZOLAM HCL 5 MG/5ML IJ SOLN
INTRAMUSCULAR | Status: DC | PRN
  Administered 2016-07-04: 2 mg via INTRAVENOUS

## 2016-07-04 MED ORDER — SUCCINYLCHOLINE CHLORIDE 200 MG/10ML IV SOSY
PREFILLED_SYRINGE | INTRAVENOUS | Status: AC
  Filled 2016-07-04: qty 10

## 2016-07-04 MED ORDER — DEXAMETHASONE SODIUM PHOSPHATE 10 MG/ML IJ SOLN
INTRAMUSCULAR | Status: AC
  Filled 2016-07-04: qty 1

## 2016-07-04 MED ORDER — FENTANYL CITRATE (PF) 100 MCG/2ML IJ SOLN
INTRAMUSCULAR | Status: DC | PRN
  Administered 2016-07-04: 25 ug via INTRAVENOUS
  Administered 2016-07-04: 100 ug via INTRAVENOUS

## 2016-07-04 MED ORDER — SCOPOLAMINE 1 MG/3DAYS TD PT72
1.0000 | MEDICATED_PATCH | Freq: Once | TRANSDERMAL | Status: DC
  Administered 2016-07-04: 1.5 mg via TRANSDERMAL

## 2016-07-04 MED ORDER — ONDANSETRON HCL 4 MG/2ML IJ SOLN
INTRAMUSCULAR | Status: DC | PRN
  Administered 2016-07-04: 4 mg via INTRAVENOUS

## 2016-07-04 MED ORDER — PROPOFOL 10 MG/ML IV BOLUS
INTRAVENOUS | Status: DC | PRN
  Administered 2016-07-04: 200 mg via INTRAVENOUS

## 2016-07-04 MED ORDER — LIDOCAINE HCL 1 % IJ SOLN
INTRAMUSCULAR | Status: AC
  Filled 2016-07-04: qty 20

## 2016-07-04 MED ORDER — IBUPROFEN 800 MG PO TABS: 800.0000 mg | ORAL_TABLET | Freq: Three times a day (TID) | ORAL | 0 refills | Status: DC | PRN

## 2016-07-04 MED ORDER — NEOSTIGMINE METHYLSULFATE 10 MG/10ML IV SOLN
INTRAVENOUS | Status: AC
Start: 2016-07-04 — End: 2016-07-04
  Filled 2016-07-04: qty 1

## 2016-07-04 MED ORDER — ONDANSETRON HCL 4 MG/2ML IJ SOLN
INTRAMUSCULAR | Status: AC
  Filled 2016-07-04: qty 2

## 2016-07-04 MED ORDER — KETOROLAC TROMETHAMINE 30 MG/ML IJ SOLN
INTRAMUSCULAR | Status: DC | PRN
  Administered 2016-07-04: 30 mg via INTRAVENOUS
  Administered 2016-07-04: 30 mg via INTRAMUSCULAR

## 2016-07-04 MED ORDER — SCOPOLAMINE 1 MG/3DAYS TD PT72
MEDICATED_PATCH | TRANSDERMAL | Status: AC
  Administered 2016-07-04: 1.5 mg via TRANSDERMAL
  Filled 2016-07-04: qty 1

## 2016-07-04 MED ORDER — LACTATED RINGERS IV SOLN
INTRAVENOUS | Status: DC
  Administered 2016-07-04: 125 mL/h via INTRAVENOUS
  Administered 2016-07-04: 10:00:00 via INTRAVENOUS

## 2016-07-04 MED ORDER — SODIUM CHLORIDE 0.9 % IR SOLN
Status: DC | PRN
  Administered 2016-07-04: 3000 mL

## 2016-07-04 MED ORDER — LIDOCAINE HCL (CARDIAC) 20 MG/ML IV SOLN
INTRAVENOUS | Status: AC
  Filled 2016-07-04: qty 5

## 2016-07-04 MED ORDER — DEXAMETHASONE SODIUM PHOSPHATE 10 MG/ML IJ SOLN
INTRAMUSCULAR | Status: DC | PRN
  Administered 2016-07-04: 10 mg via INTRAVENOUS

## 2016-07-04 MED ORDER — LIDOCAINE HCL 1 % IJ SOLN
INTRAMUSCULAR | Status: DC | PRN
  Administered 2016-07-04: 10 mL

## 2016-07-04 MED ORDER — PROPOFOL 10 MG/ML IV BOLUS
INTRAVENOUS | Status: AC
  Filled 2016-07-04: qty 20

## 2016-07-04 MED ORDER — ROCURONIUM BROMIDE 100 MG/10ML IV SOLN
INTRAVENOUS | Status: AC
  Filled 2016-07-04: qty 1

## 2016-07-04 MED ORDER — HYDROCODONE-ACETAMINOPHEN 5-325 MG PO TABS: 1.0000 | ORAL_TABLET | Freq: Four times a day (QID) | ORAL | 0 refills | Status: DC | PRN

## 2016-07-04 MED ORDER — GLYCOPYRROLATE 0.2 MG/ML IJ SOLN
INTRAMUSCULAR | Status: AC
  Filled 2016-07-04: qty 3

## 2016-07-04 SURGICAL SUPPLY — 23 items
BIPOLAR CUTTING LOOP 21FR (ELECTRODE)
CATH ROBINSON RED A/P 16FR (CATHETERS) ×3 IMPLANT
CLOTH BEACON ORANGE TIMEOUT ST (SAFETY) ×3 IMPLANT
CONT PATH 16OZ SNAP LID 3702 (MISCELLANEOUS) ×1 IMPLANT
CONTAINER PREFILL 10% NBF 60ML (FORM) ×2 IMPLANT
ELECT REM PT RETURN 9FT ADLT (ELECTROSURGICAL)
ELECTRODE REM PT RTRN 9FT ADLT (ELECTROSURGICAL) IMPLANT
GLOVE BIO SURGEON STRL SZ 6.5 (GLOVE) ×2 IMPLANT
GLOVE BIO SURGEONS STRL SZ 6.5 (GLOVE) ×1
GLOVE BIOGEL PI IND STRL 7.0 (GLOVE) ×3 IMPLANT
GLOVE BIOGEL PI INDICATOR 7.0 (GLOVE) ×6
GLOVE ECLIPSE 6.5 STRL STRAW (GLOVE) ×6 IMPLANT
GOWN STRL REUS W/TWL LRG LVL3 (GOWN DISPOSABLE) ×9 IMPLANT
LOOP CUTTING BIPOLAR 21FR (ELECTRODE) IMPLANT
NDL SPNL 22GX3.5 QUINCKE BK (NEEDLE) ×1 IMPLANT
NEEDLE SPNL 22GX3.5 QUINCKE BK (NEEDLE) IMPLANT
PACK VAGINAL MINOR WOMEN LF (CUSTOM PROCEDURE TRAY) ×3 IMPLANT
PAD OB MATERNITY 4.3X12.25 (PERSONAL CARE ITEMS) ×3 IMPLANT
PAD PREP 24X48 CUFFED NSTRL (MISCELLANEOUS) ×3 IMPLANT
TOWEL OR 17X24 6PK STRL BLUE (TOWEL DISPOSABLE) ×6 IMPLANT
TUBING AQUILEX INFLOW (TUBING) ×5 IMPLANT
TUBING AQUILEX OUTFLOW (TUBING) ×3 IMPLANT
WATER STERILE IRR 1000ML POUR (IV SOLUTION) ×3 IMPLANT

## 2016-07-04 NOTE — Op Note (Signed)
07/04/2016  9:55 AM  PATIENT:  Heather Cochran  51 y.o. female  PRE-OPERATIVE DIAGNOSIS:  Retained IUD with inability to remove in the office, H/O menorrhagia, fibroid uterus  POST-OPERATIVE DIAGNOSIS:  Same  PROCEDURE:  Procedure(s): HYSTEROSCOPY with removal of IUD INTRAUTERINE DEVICE (IUD) INSERTION  SURGEON:  Jazelyn Sipe SUZANNE  ASSISTANTS: OR staff   ANESTHESIA:   general  ESTIMATED BLOOD LOSS: 5cc  BLOOD ADMINISTERED:none   FLUIDS: 1000ccLR  UOP: 30cc clear UOP drained with I&O cath at beginning of procedure  SPECIMEN:  none  DISPOSITION OF SPECIMEN:  PATHOLOGY  FINDINGS: IUD within endometrial cavity, thin endometrium, enlarged uterus about 12 weeks in size with fundal fibroid off to the right  DESCRIPTION OF OPERATION: Patient was taken to the operating room.  She is placed in the supine position. SCDs were on her lower extremities and functioning properly. General anesthesia with an LMA was administered without difficulty.   Legs were then placed in the [place] in the low lithotomy position. The legs were lifted to the high lithotomy position and the Betadine prep was used on the inner thighs perineum and vagina x3. Patient was draped in a normal standard fashion. An in and out catheterization with a red rubber Foley catheter was performed. Approximately 30 cc of clear urine was noted. A bivalve speculum was placed the vagina. The anterior lip of the cervix was grasped with single-tooth tenaculum.  A paracervical block of 1% lidocaine mixed one-to-one with epinephrine (1:100,000 units).  10 cc was used total.  Pratt dilators could not be passed.  There felt to be an obstruction within the canal.  Milex dilator was obtained and passed without difficulty.  After several attempts of trying to pass the [facility] dilators, decision made to see if I could just visualize the strings with the hysteroscope.     A 2.9 millimeter diagnostic hysteroscope was obtained. NS was used  as a hysteroscopic fluid. The hysteroscope was advanced through the endocervical canal and IUD strings were noted.  These were followed until the hysteroscopic was within the endometrial cavity.  IUD was in correct placement and the IUD stem was all the lower portion of the endometrial cavity.  Tubal ostia were noted and photodocumentation was obtained.  Attempt was made to pass and hysteroscopic grasper into the hysteroscope but the instrument would not pass.  Hysteroscope was removed and string was followed.  The string uncurled and there ends of the string were less than a centimeter from the os.  A polyp forceps was obtained and this was passed just into the endocervical canal.  The strings were grasped and IUD removed without difficulty.   The hysteroscope was used to visualize the cavity again.  The cavity was empty at this point and without lesions.  No tissue specimens were obtained.  Then the hysteroscope was removed and the uterus sounded to 12 cm.  It was clear that most of this was the cervix (as was seen with passing the hysteroscope into the endometrial cavity earlier).  The IUD and intruducer were then passed to the fundus, withdrawn slightly, and then the IUD deployed.  The introducer was removed and IUD strings cut to 4cm.    At this point no other procedure was ended. The hysteroscope was removed.  The tenaculum was removed from the anterior lip of the cervix.  Minimal bleeding was noted.  No bleeding from the cervical os was noted.  The fluid deficit was 100 cc. The speculum was returned  vagina. The prep was cleansed of the patient's skin. The legs are positioned back in the supine position. Sponge, lap, needle, initially counts were correct x2. Patient was taken to recovery in stable condition.  COUNTS:  YES  PLAN OF CARE: Transfer to PACU

## 2016-07-04 NOTE — Anesthesia Postprocedure Evaluation (Addendum)
Anesthesia Post Note  Patient: Heather Cochran  Procedure(s) Performed: Procedure(s) (LRB): HYSTEROSCOPY with removal of IUD (N/A) INTRAUTERINE DEVICE (IUD) INSERTION (N/A)  Patient location during evaluation: PACU Anesthesia Type: General Level of consciousness: sedated Pain management: pain level controlled Vital Signs Assessment: post-procedure vital signs reviewed and stable Respiratory status: spontaneous breathing and respiratory function stable Cardiovascular status: stable Anesthetic complications: no        Last Vitals:  Vitals:   07/04/16 1045 07/04/16 1100  BP: 121/62   Pulse: 80 80  Resp: 13 13  Temp:      Last Pain:  Vitals:   07/04/16 0833  TempSrc: Oral   Pain Goal: Patients Stated Pain Goal: 5 (07/04/16 0833)               [place]

## 2016-07-04 NOTE — Anesthesia Preprocedure Evaluation (Signed)
Anesthesia Evaluation  Patient identified by MRN, date of birth, ID band Patient awake    Reviewed: Allergy & Precautions, NPO status , Patient's Chart, lab work & pertinent test results  Airway Mallampati: II  TM Distance: >3 FB Neck ROM: Full    Dental no notable dental hx. (+) Dental Advisory Given   Pulmonary neg pulmonary ROS, former smoker,    Pulmonary exam normal        Cardiovascular negative cardio ROS Normal cardiovascular exam     Neuro/Psych negative neurological ROS  negative psych ROS   GI/Hepatic negative GI ROS, Neg liver ROS,   Endo/Other  negative endocrine ROS  Renal/GU negative Renal ROS  negative genitourinary   Musculoskeletal negative musculoskeletal ROS (+)   Abdominal   Peds negative pediatric ROS (+)  Hematology negative hematology ROS (+)   Anesthesia Other Findings   Reproductive/Obstetrics negative OB ROS                             Anesthesia Physical Anesthesia Plan  ASA: II  Anesthesia Plan: General   Post-op Pain Management:    Induction: Intravenous  Airway Management Planned: LMA  Additional Equipment:   Intra-op Plan:   Post-operative Plan: Extubation in OR  Informed Consent: I have reviewed the patients History and Physical, chart, labs and discussed the procedure including the risks, benefits and alternatives for the proposed anesthesia with the patient or authorized representative who has indicated his/her understanding and acceptance.   Dental advisory given  Plan Discussed with: CRNA and Anesthesiologist  Anesthesia Plan Comments:         Anesthesia Quick Evaluation

## 2016-07-04 NOTE — Discharge Instructions (Addendum)
Post-surgical Instructions, Outpatient Surgery  You may expect to feel dizzy, weak, and drowsy for as long as 24 hours after receiving the medicine that made you sleep (anesthetic). For the first 24 hours after your surgery:    Do not drive a car, ride a bicycle, participate in physical activities, or take public transportation until you are done taking narcotic pain medicines or as directed by Dr. Sabra Heck.   Do not drink alcohol or take tranquilizers.   Do not take medicine that has not been prescribed by your physicians.   Do not sign important papers or make important decisions while on narcotic pain medicines.   Have a responsible person with you.   PAIN MANAGEMENT  Motrin 800mg .  (This is the same as 4-200mg  over the counter tablets of Motrin or ibuprofen.)  You may take this every eight hours or as needed for cramping.    Vicodin 5/325mg .  For more severe pain, take one or two tablets every four to six hours as needed for pain control.  (Remember that narcotic pain medications increase your risk of constipation.  If this becomes a problem, you may take an over the counter stool softener like Colace 100mg  up to four times a day.)  DO'S AND DON'T'S  Do not take a tub bath for one week.  You may shower on the first day after your surgery  Do not do any heavy lifting for one to two weeks.  This increases the chance of bleeding.  Do move around as you feel able.  Stairs are fine.  You may begin to exercise again as you feel able.  Do not lift any weights for two weeks.  Do not put anything in the vagina for two weeks--no tampons, intercourse, or douching.    Do remove the patch from behind your ear anytime between now and 3 days post op.  REGULAR MEDIATIONS/VITAMINS:  You may restart all of your regular medications as prescribed.  You may restart all of your vitamins as you normally take them.    PLEASE CALL OR SEEK MEDICAL CARE IF:  You have persistent nausea and vomiting.     You have trouble eating or drinking.   You have an oral temperature above 100.5.   You have constipation that is not helped by adjusting diet or increasing fluid intake. Pain medicines are a common cause of constipation.   You have heavy vaginal bleeding     Post Anesthesia Home Care Instructions  Activity: Get plenty of rest for the remainder of the day. A responsible adult should stay with you for 24 hours following the procedure.  For the next 24 hours, DO NOT: -Drive a car -Paediatric nurse -Drink alcoholic beverages -Take any medication unless instructed by your physician -Make any legal decisions or sign important papers.  Meals: Start with liquid foods such as gelatin or soup. Progress to regular foods as tolerated. Avoid greasy, spicy, heavy foods. If nausea and/or vomiting occur, drink only clear liquids until the nausea and/or vomiting subsides. Call your physician if vomiting continues.  Special Instructions/Symptoms: Your throat may feel dry or sore from the anesthesia or the breathing tube placed in your throat during surgery. If this causes discomfort, gargle with warm salt water. The discomfort should disappear within 24 hours.  If you had a scopolamine patch placed behind your ear for the management of post- operative nausea and/or vomiting:  1. The medication in the patch is effective for 72 hours, after which it should  be removed.  Wrap patch in a tissue and discard in the trash. Wash hands thoroughly with soap and water. 2. You may remove the patch earlier than 72 hours if you experience unpleasant side effects which may include dry mouth, dizziness or visual disturbances. 3. Avoid touching the patch. Wash your hands with soap and water after contact with the patch.

## 2016-07-04 NOTE — Transfer of Care (Signed)
Immediate Anesthesia Transfer of Care Note  Patient: Heather Cochran  Procedure(s) Performed: Procedure(s): HYSTEROSCOPY with removal of IUD (N/A) INTRAUTERINE DEVICE (IUD) INSERTION (N/A)  Patient Location: PACU  Anesthesia Type:General  Level of Consciousness: awake, alert , oriented and patient cooperative  Airway & Oxygen Therapy: Patient Spontanous Breathing  Post-op Assessment: Report given to RN, Post -op Vital signs reviewed and stable and Patient moving all extremities X 4  Post vital signs: Reviewed and stable  Last Vitals:  Vitals:   07/04/16 0833  BP: 121/73  Pulse: 67  Resp: 16  Temp: 36.6 C    Last Pain:  Vitals:   07/04/16 0833  TempSrc: Oral      Patients Stated Pain Goal: 5 ([id number])  Complications: No apparent anesthesia complications

## 2016-07-04 NOTE — OR Nursing (Signed)
Consulted Dr. Tobias Alexander regarding pro op labs, stated no labs needed prior to surgery.

## 2016-07-04 NOTE — H&P (Signed)
Heather Cochran is an 51 y.o. female  [phone] MWF here for hysteroscopic removal of IUD due to inability to remove in the office.  Strings are non-visible.  I tried with ultrasound guidance and I was clearly in the endometrial cavity but due to pt discomfort, the in-office procedure was ended.  Procedure, risks, benefits, and alternatives have been reviewed.  Due to difficulty with office removal, pt did not want to attempt this again in the office.  She is here for hysteroscopic IUD removal and replacement of new Mirena IUD.    Pertinent Gynecological History: Menses: amenorrhea Bleeding: none Contraception: IUD DES exposure: denies Blood transfusions: none Sexually transmitted diseases: no past history Previous GYN Procedures: none  Last mammogram: normal Date: 2/17 Last pap: normal Date: 2/16 OB History: G3, P2   Menstrual History: No LMP recorded. Patient is not currently having periods (Reason: IUD).    Past Medical History:  Diagnosis Date  . Depression   . Encounter for insertion of mirena IUD 06/29/11  . Pneumonia    childhood    Past Surgical History:  Procedure Laterality Date  . CERVICAL DISC ARTHROPLASTY  2010  . CESAREAN SECTION  1996  . INTRAUTERINE DEVICE INSERTION  07/05/11   Mirena  . MICRODISCECTOMY LUMBAR  02/2001   L4-5    Family History  Problem Relation Age of Onset  . Hypertension Mother   . Depression Father   . Kidney cancer Brother   . Depression Maternal Grandmother   . Melanoma Maternal Grandmother 80    mets to colon  . Kidney cancer Maternal Grandmother     Social History:  reports that she quit smoking about 24 years ago. She has a 5.00 pack-year smoking history. She has never used smokeless tobacco. She reports that she drinks alcohol. She reports that she does not use drugs.  Allergies:  Allergies  Allergen Reactions  . Tetanus Toxoids Other (See Comments)    Local Reaction    Prescriptions Prior to Admission  Medication Sig  Dispense Refill Last Dose  . buPROPion (WELLBUTRIN XL) 300 MG 24 hr tablet Take 1 tablet (300 mg total) by mouth daily. 90 tablet 3 07/04/2016 at 0730  . misoprostol (CYTOTEC) 200 MCG tablet Insert one tablet vaginally evening before and morning of procedure. 2 tablet 0 07/04/2016 at 0730  . venlafaxine XR (EFFEXOR-XR) 75 MG 24 hr capsule TAKE 1 CAPSULE DAILY WITH BREAKFAST 90 capsule 3 07/04/2016 at 0730  . levonorgestrel (MIRENA) 20 MCG/24HR IUD 1 each by Intrauterine route once.   More than a month at Unknown time    Review of Systems  All other systems reviewed and are negative.   Blood pressure 121/73, pulse 67, temperature 97.8 F (36.6 C), temperature source Oral, resp. rate 16, height 5\' 4"  (1.626 m), weight 170 lb (77.1 kg), SpO2 97 %. Physical Exam  Constitutional: She is oriented to person, place, and time. She appears well-developed and well-nourished.  Cardiovascular: Normal rate and regular rhythm.   Respiratory: Effort normal and breath sounds normal.  Neurological: She is alert and oriented to person, place, and time.  Skin: Skin is warm and dry.  Psychiatric: She has a normal mood and affect.    Results for orders placed or performed during the hospital encounter of 07/04/16 (from the past 24 hour(s))  Pregnancy, urine     Status: None   Collection Time: 07/04/16  8:00 AM  Result Value Ref Range   Preg Test, Ur NEGATIVE NEGATIVE  No results found.  Assessment/Plan: 51 yo G3P2 MWF here for hysteroscopic IUD removal and replacement of new Mirena IUD due to inability to remove IUD in the office and non-visualized strings.  Procedure reviewed.  Questions answered.  Pt ready to proceed.  H[facility] Bogus [facility] 07/04/2016, 8:39 AM

## 2016-07-04 NOTE — Anesthesia Procedure Notes (Signed)
Procedure Name: LMA Insertion Date/Time: 07/04/2016 9:22 AM Performed by: Adalberto Ill Pre-anesthesia Checklist: Patient identified, Emergency Drugs available, Suction available, Patient being monitored and Timeout performed Patient Re-evaluated:Patient Re-evaluated prior to inductionOxygen Delivery Method: Circle system utilized Preoxygenation: Pre-oxygenation with 100% oxygen Intubation Type: IV induction LMA: LMA inserted LMA Size: 4.0 Number of attempts: 1 ETT to lip (cm): yes. Tube secured with: Tape Dental Injury: Teeth and Oropharynx as per pre-operative assessment

## 2016-07-05 ENCOUNTER — Encounter (HOSPITAL_COMMUNITY): Payer: Self-pay | Admitting: Obstetrics & Gynecology

## 2016-07-11 ENCOUNTER — Ambulatory Visit: Payer: 59 | Admitting: Nurse Practitioner

## 2016-07-22 ENCOUNTER — Ambulatory Visit
Admission: RE | Admit: 2016-07-22 | Discharge: 2016-07-22 | Disposition: A | Discharge status: Home / Self Care | Payer: 59 | Source: Ambulatory Visit | Attending: Family Medicine | Admitting: Family Medicine

## 2016-07-22 DIAGNOSIS — Z1231 Encounter for screening mammogram for malignant neoplasm of breast: Secondary | ICD-10-CM

## 2016-08-04 NOTE — Progress Notes (Signed)
51 y.o. G75P2012 Married Caucasian F here for annual exam.  Just got back from [place] and got a really bad sun burn.  Had IUD removal in the OR.  Had a little spotting right after procedure.    PCP:  Maurice Small.   No LMP recorded. Patient is not currently having periods (Reason: IUD).          Sexually active: Yes.    The current method of family planning is IUD.    Exercising: No.  The patient does not participate in regular exercise at present. Smoker:  Former smoker   Health Maintenance: Pap:  07/08/14 negative, HR HPV negative  History of abnormal Pap:  no MMG:  07/22/16 BIRADS 1 negative  Colonoscopy:  4/16 normal- repeat 10 years  BMD:   never TDaP:  Allergic  Pneumonia vaccine(s):  never Zostavax:   never Hep C testing: not indicated  Screening Labs: drawn today, Hb today: same   reports that she quit smoking about 24 years ago. She has a 5.00 pack-year smoking history. She has never used smokeless tobacco. She reports that she drinks alcohol. She reports that she does not use drugs.  Past Medical History:  Diagnosis Date  . Depression   . Encounter for insertion of mirena IUD 06/29/11  . Pneumonia    childhood    Past Surgical History:  Procedure Laterality Date  . CERVICAL DISC ARTHROPLASTY  2010  . CESAREAN SECTION  1996  . HYSTEROSCOPY N/A 07/04/2016   Procedure: HYSTEROSCOPY with removal of IUD;  Surgeon: Megan Salon, MD;  Location: [facility];  Service: Gynecology;  Laterality: N/A;  . INTRAUTERINE DEVICE (IUD) INSERTION N/A 07/04/2016   Procedure: INTRAUTERINE DEVICE (IUD) INSERTION;  Surgeon: Megan Salon, MD;  Location: [facility];  Service: Gynecology;  Laterality: N/A;  . INTRAUTERINE DEVICE INSERTION  07/05/11   Mirena  . MICRODISCECTOMY LUMBAR  02/2001   L4-5    Current Outpatient Prescriptions  Medication Sig Dispense Refill  . buPROPion (WELLBUTRIN XL) 300 MG 24 hr tablet Take 1 tablet (300 mg total) by mouth daily. 90 tablet 3  . levonorgestrel (MIRENA) 20  MCG/24HR IUD 1 each by Intrauterine route once.    . venlafaxine XR (EFFEXOR-XR) 75 MG 24 hr capsule TAKE 1 CAPSULE DAILY WITH BREAKFAST 90 capsule 3   No current facility-administered medications for this visit.     Family History  Problem Relation Age of Onset  . Hypertension Mother   . Depression Father   . Kidney cancer Brother   . Depression Maternal Grandmother   . Melanoma Maternal Grandmother 80    mets to colon  . Kidney cancer Maternal Grandmother     ROS:  Pertinent items are noted in HPI.  Otherwise, a comprehensive ROS was negative.  Exam:   BP 116/68 (BP Location: Right Arm, Patient Position: Sitting, Cuff Size: Normal)   Pulse 68   Resp 14   Ht 5' 3.25" (1.607 m)   Wt 176 lb (79.8 kg)   BMI 30.93 kg/m    Height: 5' 3.25" (160.7 cm)  Ht Readings from Last 3 Encounters:  08/08/16 5' 3.25" (1.607 m)  06/27/16 5\' 4"  (1.626 m)  05/20/16 5\' 4"  (1.626 m)    General appearance: alert, cooperative and appears stated age Head: Normocephalic, without obvious abnormality, atraumatic Neck: no adenopathy, supple, symmetrical, trachea midline and thyroid normal to inspection and palpation Lungs: clear to auscultation bilaterally Breasts: normal appearance, no masses or tenderness Heart: regular rate  and rhythm Abdomen: soft, non-tender; bowel sounds normal; no masses,  no organomegaly Extremities: extremities normal, atraumatic, no cyanosis or edema Skin: Skin color, texture, turgor normal. No rashes or lesions Lymph nodes: Cervical, supraclavicular, and axillary nodes normal. No abnormal inguinal nodes palpated Neurologic: Grossly normal   Pelvic: External genitalia:  no lesions              Urethra:  normal appearing urethra with no masses, tenderness or lesions              Bartholins and Skenes: normal                 Vagina: normal appearing vagina with normal color and discharge, no lesions              Cervix: no lesions and IUD string noted 2cm               Pap taken: Yes.   Bimanual Exam:  Uterus:  enlarged, 10 weeks size, globular consistent with known fibroids              Adnexa: no masses, non tender               Rectovaginal: Confirms               Anus:  normal sphincter tone, no lesions  Chaperone was present for exam.  A:  Well Woman with normal exam Fibroid uterus Amenorrhea with Mirena IUD, removal and replacement 07/04/16 Depression hx  P:   Mammogram guidelines reviewed  pap smear obtained today.  Neg HR HPV 2015 CBC, CMP, Lipids, TSH, Vit D obtained today Rx for Wellbutrin Xl 300mg  daily and Effexor XR 75mg  daily.  #90/3RF Return annually or prn

## 2016-08-08 ENCOUNTER — Encounter: Payer: Self-pay | Admitting: Obstetrics & Gynecology

## 2016-08-08 ENCOUNTER — Ambulatory Visit (INDEPENDENT_AMBULATORY_CARE_PROVIDER_SITE_OTHER): Payer: 59 | Admitting: Obstetrics & Gynecology

## 2016-08-08 ENCOUNTER — Other Ambulatory Visit (HOSPITAL_COMMUNITY)
Admission: RE | Admit: 2016-08-08 | Discharge: 2016-08-08 | Disposition: A | Discharge status: Home / Self Care | Payer: 59 | Source: Ambulatory Visit | Attending: Obstetrics & Gynecology | Admitting: Obstetrics & Gynecology

## 2016-08-08 VITALS — BP 116/68 | HR 68 | Resp 14 | Ht 63.25 in | Wt 176.0 lb

## 2016-08-08 DIAGNOSIS — Z01419 Encounter for gynecological examination (general) (routine) without abnormal findings: Secondary | ICD-10-CM | POA: Insufficient documentation

## 2016-08-08 DIAGNOSIS — E559 Vitamin D deficiency, unspecified: Secondary | ICD-10-CM

## 2016-08-08 DIAGNOSIS — Z01411 Encounter for gynecological examination (general) (routine) with abnormal findings: Secondary | ICD-10-CM | POA: Diagnosis not present

## 2016-08-08 DIAGNOSIS — Z124 Encounter for screening for malignant neoplasm of cervix: Secondary | ICD-10-CM

## 2016-08-08 DIAGNOSIS — Z0000 Encounter for general adult medical examination without abnormal findings: Secondary | ICD-10-CM

## 2016-08-08 LAB — LIPID PANEL
CHOL/HDL RATIO: 3.3 ratio (ref <5.0)
Cholesterol: 187 mg/dL (ref <200)
HDL: 57 mg/dL (ref >50)
LDL CALC: 107 mg/dL — AB (ref <100)
TRIGLYCERIDES: 114 mg/dL (ref <150)
VLDL: 23 mg/dL (ref <30)

## 2016-08-08 LAB — COMPREHENSIVE METABOLIC PANEL
ALK PHOS: 55 U/L (ref 33–115)
ALT: 7 U/L (ref 6–29)
AST: 11 U/L (ref 10–35)
Albumin: 4.1 g/dL (ref 3.6–5.1)
BILIRUBIN TOTAL: 0.4 mg/dL (ref 0.2–1.2)
BUN: 13 mg/dL (ref 7–25)
CO2: 29 mmol/L (ref 20–31)
Calcium: 9.5 mg/dL (ref 8.6–10.2)
Chloride: 103 mmol/L (ref 98–110)
Creat: 0.68 mg/dL (ref 0.50–1.10)
GLUCOSE: 96 mg/dL (ref 65–99)
POTASSIUM: 4.5 mmol/L (ref 3.5–5.3)
Sodium: 140 mmol/L (ref 135–146)
Total Protein: 6.7 g/dL (ref 6.1–8.1)

## 2016-08-08 LAB — CBC
HEMATOCRIT: 42.2 % (ref 35.0–45.0)
HEMOGLOBIN: 13.6 g/dL (ref 11.7–15.5)
MCH: 29.5 pg (ref 27.0–33.0)
MCHC: 32.2 g/dL (ref 32.0–36.0)
MCV: 91.5 fL (ref 80.0–100.0)
MPV: 11.1 fL (ref 7.5–12.5)
Platelets: 390 10*3/uL (ref 140–400)
RBC: 4.61 MIL/uL (ref 3.80–5.10)
RDW: 13 % (ref 11.0–15.0)
WBC: 8.4 10*3/uL (ref 3.8–10.8)

## 2016-08-08 LAB — TSH: TSH: 1.54 m[IU]/L

## 2016-08-08 MED ORDER — VENLAFAXINE HCL ER 75 MG PO CP24: ORAL_CAPSULE | ORAL | 3 refills | Status: DC

## 2016-08-08 MED ORDER — BUPROPION HCL ER (XL) 300 MG PO TB24: 300.0000 mg | ORAL_TABLET | Freq: Every day | ORAL | 3 refills | Status: DC

## 2016-08-09 LAB — VITAMIN D 25 HYDROXY (VIT D DEFICIENCY, FRACTURES): Vit D, 25-Hydroxy: 15 ng/mL — ABNORMAL LOW (ref 30–100)

## 2016-08-10 LAB — CYTOLOGY - PAP: DIAGNOSIS: NEGATIVE

## 2016-08-12 ENCOUNTER — Telehealth: Payer: Self-pay | Admitting: *Deleted

## 2016-08-12 NOTE — Telephone Encounter (Signed)
-----   Message from Megan Salon, MD sent at 08/12/2016  4:41 AM EST ----- Please notify pt that her CBC, CMP, TSH and pap were all normal.  02 recall.  Vit D 15.  Needs rx for Vit D 50K weekly for 12 weeks and then Vit D recheck.  Order for lab has been placed.  Lipid panel with mildly elevated LDL at 107 but total cholesterol was 187 (goal <200) and triglycerides and HDLs both good as wel.  No treatment needed.  Repeat 2-3 years.

## 2016-08-12 NOTE — Addendum Note (Signed)
Addended by: Megan Salon on: 08/12/2016 04:42 AM   Modules accepted: Orders

## 2016-08-12 NOTE — Telephone Encounter (Signed)
LM for pt to call back.

## 2016-08-15 MED ORDER — VITAMIN D (ERGOCALCIFEROL) 1.25 MG (50000 UNIT) PO CAPS: 50000.0000 [IU] | ORAL_CAPSULE | ORAL | 0 refills | Status: DC

## 2016-08-15 NOTE — Telephone Encounter (Signed)
Patient notified of results. Verbalized understanding.  Rx sent to pharmacy. Lab appt scheduled for 11/22/16 @11 :30am

## 2016-11-04 NOTE — Addendum Note (Signed)
Addended by: Zoila Shutter D on: 11/04/2016 12:19 PM   Modules accepted: Orders

## 2016-11-05 NOTE — Addendum Note (Signed)
Addendum  created 11/05/16 0843 by Duane Boston, MD   Sign clinical note

## 2016-11-22 ENCOUNTER — Other Ambulatory Visit (INDEPENDENT_AMBULATORY_CARE_PROVIDER_SITE_OTHER): Payer: 59

## 2016-11-22 DIAGNOSIS — E559 Vitamin D deficiency, unspecified: Secondary | ICD-10-CM

## 2016-11-23 LAB — VITAMIN D 25 HYDROXY (VIT D DEFICIENCY, FRACTURES): VIT D 25 HYDROXY: 25.6 ng/mL — AB (ref 30.0–100.0)

## 2016-11-24 ENCOUNTER — Telehealth: Payer: Self-pay

## 2016-11-24 NOTE — Telephone Encounter (Signed)
Spoke with patient. Advised of results as seen below from [place]. Patient is agreeable and verbalizes understanding.  Routing to provider for final review. Patient agreeable to disposition. Will close encounter.

## 2016-11-24 NOTE — Telephone Encounter (Signed)
Patient returning call.

## 2016-11-24 NOTE — Telephone Encounter (Signed)
Left message to call Heather Cochran at [phone]. 

## 2016-11-24 NOTE — Telephone Encounter (Signed)
-----   Message from Megan Salon, MD sent at 11/23/2016  1:10 PM EDT ----- Please let pt know her Vit D is improved at 25.6, up from 15.  She needs to continue 1000 IU Vit D daily.  I will repeat this next year at her AEX.

## 2017-06-27 ENCOUNTER — Other Ambulatory Visit: Payer: Self-pay | Admitting: Family Medicine

## 2017-06-27 DIAGNOSIS — Z139 Encounter for screening, unspecified: Secondary | ICD-10-CM

## 2017-07-24 ENCOUNTER — Ambulatory Visit
Admission: RE | Admit: 2017-07-24 | Discharge: 2017-07-24 | Disposition: A | Discharge status: Home / Self Care | Payer: 59 | Source: Ambulatory Visit | Attending: Family Medicine | Admitting: Family Medicine

## 2017-07-24 DIAGNOSIS — Z139 Encounter for screening, unspecified: Secondary | ICD-10-CM

## 2017-11-03 ENCOUNTER — Encounter: Payer: Self-pay | Admitting: Obstetrics & Gynecology

## 2017-11-03 ENCOUNTER — Encounter

## 2017-11-03 ENCOUNTER — Other Ambulatory Visit (HOSPITAL_COMMUNITY)
Admission: RE | Admit: 2017-11-03 | Discharge: 2017-11-03 | Disposition: A | Discharge status: Home / Self Care | Payer: 59 | Source: Ambulatory Visit | Attending: Obstetrics & Gynecology | Admitting: Obstetrics & Gynecology

## 2017-11-03 ENCOUNTER — Other Ambulatory Visit: Payer: Self-pay

## 2017-11-03 ENCOUNTER — Ambulatory Visit (INDEPENDENT_AMBULATORY_CARE_PROVIDER_SITE_OTHER): Payer: 59 | Admitting: Obstetrics & Gynecology

## 2017-11-03 VITALS — BP 120/66 | HR 80 | Resp 16 | Ht 63.25 in | Wt 188.8 lb

## 2017-11-03 DIAGNOSIS — E559 Vitamin D deficiency, unspecified: Secondary | ICD-10-CM

## 2017-11-03 DIAGNOSIS — Z124 Encounter for screening for malignant neoplasm of cervix: Secondary | ICD-10-CM

## 2017-11-03 DIAGNOSIS — Z01411 Encounter for gynecological examination (general) (routine) with abnormal findings: Secondary | ICD-10-CM | POA: Diagnosis not present

## 2017-11-03 MED ORDER — BUPROPION HCL ER (XL) 300 MG PO TB24: 300.0000 mg | ORAL_TABLET | Freq: Every day | ORAL | 4 refills | Status: DC

## 2017-11-03 MED ORDER — VENLAFAXINE HCL ER 75 MG PO CP24: ORAL_CAPSULE | ORAL | 4 refills | Status: DC

## 2017-11-03 NOTE — Progress Notes (Signed)
52 y.o. [id number] MarriedCaucasianF here for annual exam.  Denies vaginal bleeding.    PCP:  Dr. Justin Mend.  Doesn't go for routine exams.   No LMP recorded. (Menstrual status: IUD).          Sexually active: Yes.    The current method of family planning is IUD. Mirena IUD placed 07/04/16 Exercising: Yes.    bike, weights Smoker:  no  Health Maintenance: Pap:  08/08/16 Neg   07/08/14 Neg. HR HPV:neg  History of abnormal Pap:  no MMG:  07/24/17 BIRADS1:Neg  Colonoscopy:  09/2014 Normal. F/u 10 years  BMD:   Never TDaP:  Allergic  Pneumonia vaccine(s):  No Shingrix:   No Hep C testing: n/a Screening Labs: Here today   reports that she quit smoking about 25 years ago. She has a 5.00 pack-year smoking history. She has never used smokeless tobacco. She reports that she drinks alcohol. She reports that she does not use drugs.  Past Medical History:  Diagnosis Date  . Depression   . Encounter for insertion of mirena IUD 06/29/11  . Pneumonia    childhood    Past Surgical History:  Procedure Laterality Date  . CERVICAL DISC ARTHROPLASTY  2010  . CESAREAN SECTION  1996  . HYSTEROSCOPY N/A 07/04/2016   Procedure: HYSTEROSCOPY with removal of IUD;  Surgeon: Megan Salon, MD;  Location: [facility];  Service: Gynecology;  Laterality: N/A;  . INTRAUTERINE DEVICE (IUD) INSERTION N/A 07/04/2016   Procedure: INTRAUTERINE DEVICE (IUD) INSERTION;  Surgeon: Megan Salon, MD;  Location: [facility];  Service: Gynecology;  Laterality: N/A;  . INTRAUTERINE DEVICE INSERTION  07/05/11   Mirena  . MICRODISCECTOMY LUMBAR  02/2001   L4-5    Current Outpatient Medications  Medication Sig Dispense Refill  . buPROPion (WELLBUTRIN XL) 300 MG 24 hr tablet Take 1 tablet (300 mg total) by mouth daily. 90 tablet 3  . cholecalciferol (VITAMIN D) 1000 units tablet Take 1,000 Units by mouth daily.    Marland Kitchen levonorgestrel (MIRENA) 20 MCG/24HR IUD 1 each by Intrauterine route once.    . venlafaxine XR (EFFEXOR-XR) 75 MG 24 hr capsule  TAKE 1 CAPSULE DAILY WITH BREAKFAST 90 capsule 3   No current facility-administered medications for this visit.     Family History  Problem Relation Age of Onset  . Hypertension Mother   . Depression Father   . Kidney cancer Brother   . Depression Maternal Grandmother   . Melanoma Maternal Grandmother 80       mets to colon  . Kidney cancer Maternal Grandmother   . Breast cancer Neg Hx     Review of Systems  Constitutional:       Weight gain   All other systems reviewed and are negative.   Exam:   BP 120/66 (BP Location: Right Arm, Patient Position: Sitting, Cuff Size: Normal)   Pulse 80   Resp 16   Ht 5' 3.25" (1.607 m)   Wt 188 lb 12.8 oz (85.6 kg)   BMI 33.18 kg/m    Height: 5' 3.25" (160.7 cm)  Ht Readings from Last 3 Encounters:  11/03/17 5' 3.25" (1.607 m)  08/08/16 5' 3.25" (1.607 m)  06/27/16 5\' 4"  (1.626 m)    General appearance: alert, cooperative and appears stated age Head: Normocephalic, without obvious abnormality, atraumatic Neck: no adenopathy, supple, symmetrical, trachea midline and thyroid normal to inspection and palpation Lungs: clear to auscultation bilaterally Breasts: normal appearance, no masses or tenderness Heart: regular rate  and rhythm Abdomen: soft, non-tender; bowel sounds normal; no masses,  no organomegaly Extremities: extremities normal, atraumatic, no cyanosis or edema Skin: Skin color, texture, turgor normal. No rashes or lesions Lymph nodes: Cervical, supraclavicular, and axillary nodes normal. No abnormal inguinal nodes palpated Neurologic: Grossly normal   Pelvic: External genitalia:  no lesions              Urethra:  normal appearing urethra with no masses, tenderness or lesions              Bartholins and Skenes: normal                 Vagina: normal appearing vagina with normal color and discharge, no lesions              Cervix: no lesions              Pap taken: yes  Bimanual Exam:  Uterus:  Enlarged about 10 weeks  size, globular, c/w known fibroids              Adnexa: no mass, fullness, tenderness               Rectovaginal: Confirms               Anus:  normal sphincter tone, no lesions  Chaperone was present for exam.  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  UTD. Colonoscopy UTD pap smear with HR HPV obtained today Vit D will be obtained today RF for Wellbutrin Xl 300mg  daily and Effexor XR 75mg  daily.  #90/3RF. return annually or prn

## 2017-11-04 LAB — VITAMIN D 25 HYDROXY (VIT D DEFICIENCY, FRACTURES): VIT D 25 HYDROXY: 27.5 ng/mL — AB (ref 30.0–100.0)

## 2017-11-07 LAB — CYTOLOGY - PAP
Diagnosis: NEGATIVE
HPV (WINDOPATH): NOT DETECTED

## 2018-07-02 ENCOUNTER — Ambulatory Visit (INDEPENDENT_AMBULATORY_CARE_PROVIDER_SITE_OTHER): Payer: 59

## 2018-07-02 ENCOUNTER — Encounter: Payer: Self-pay | Admitting: Podiatry

## 2018-07-02 ENCOUNTER — Ambulatory Visit (INDEPENDENT_AMBULATORY_CARE_PROVIDER_SITE_OTHER): Payer: 59 | Admitting: Podiatry

## 2018-07-02 VITALS — BP 123/85 | HR 68

## 2018-07-02 DIAGNOSIS — M79671 Pain in right foot: Secondary | ICD-10-CM

## 2018-07-02 DIAGNOSIS — M84374A Stress fracture, right foot, initial encounter for fracture: Secondary | ICD-10-CM | POA: Diagnosis not present

## 2018-07-02 MED ORDER — MELOXICAM 15 MG PO TABS: 15.0000 mg | ORAL_TABLET | Freq: Every day | ORAL | 1 refills | Status: AC

## 2018-07-02 MED ORDER — METHYLPREDNISOLONE 4 MG PO TBPK: ORAL_TABLET | ORAL | 0 refills | Status: DC

## 2018-07-03 ENCOUNTER — Other Ambulatory Visit: Payer: Self-pay | Admitting: Family Medicine

## 2018-07-03 DIAGNOSIS — Z1231 Encounter for screening mammogram for malignant neoplasm of breast: Secondary | ICD-10-CM

## 2018-07-07 DIAGNOSIS — M84373A Stress fracture, unspecified ankle, initial encounter for fracture: Secondary | ICD-10-CM

## 2018-07-07 HISTORY — DX: Stress fracture, unspecified ankle, initial encounter for fracture: M84.373A

## 2018-07-08 NOTE — Progress Notes (Signed)
   HPI: 53 year old female presenting today as a new patient with a chief complaint of pain to the lateral right foot that began 10-11 months ago. She states the pain started when she started going to the gym. She states the pain radiates down to the plantar aspect of the 5th toe. Touching the area increases the pain. She has not done anything for treatment. Patient is here for further evaluation and treatment.  Past Medical History:  Diagnosis Date  . Depression   . Encounter for insertion of mirena IUD 06/29/11  . Pneumonia    childhood     Physical Exam: General: The patient is alert and oriented x3 in no acute distress.  Dermatology: Skin is warm, dry and supple bilateral lower extremities. Negative for open lesions or macerations.  Vascular: Palpable pedal pulses bilaterally. No edema or erythema noted. Capillary refill within normal limits.  Neurological: Epicritic and protective threshold grossly intact bilaterally.   Musculoskeletal Exam: Pain with palpation along the 5th metatarsal of the right foot. Range of motion within normal limits to all pedal and ankle joints bilateral. Muscle strength 5/5 in all groups bilateral.   Radiographic Exam:  Normal osseous mineralization. Joint spaces preserved. No fracture/dislocation/boney destruction.    Assessment: 1. 5th metatarsal stress fracture right - based on clinical exam    Plan of Care:  1. Patient evaluated. X-Rays reviewed.  2. Injection of 0.5 mLs Celestone Soluspan injected just plantar to the 5th metatarsal diaphysis.  3. Prescription for Medrol Dose Pak provided to patient. 4. Prescription for Meloxicam provided to patient. 5. Recommended good shoe gear.  6. Return to clinic as needed.       Edrick Kins, DPM [facility]  Dr. Edrick Kins, DPM    [address], [place] [address]                Office [phone]  Fax [phone]

## 2018-07-30 ENCOUNTER — Ambulatory Visit (INDEPENDENT_AMBULATORY_CARE_PROVIDER_SITE_OTHER): Payer: 59 | Admitting: Podiatry

## 2018-07-30 DIAGNOSIS — M84374D Stress fracture, right foot, subsequent encounter for fracture with routine healing: Secondary | ICD-10-CM | POA: Diagnosis not present

## 2018-08-01 NOTE — Progress Notes (Signed)
   HPI: 53 year old female presenting today for follow up evaluation of a stress fracture of the 5th metatarsal of the right foot. She states her pain has improved significantly. Taking the Meloxicam and receiving the injection at the last visit has helped to alleviate her symptoms. She denies any modifying factors. Patient is here for further evaluation and treatment.  Past Medical History:  Diagnosis Date  . Depression   . Encounter for insertion of mirena IUD 06/29/11  . Pneumonia    childhood     Physical Exam: General: The patient is alert and oriented x3 in no acute distress.  Dermatology: Skin is warm, dry and supple bilateral lower extremities. Negative for open lesions or macerations.  Vascular: Palpable pedal pulses bilaterally. No edema or erythema noted. Capillary refill within normal limits.  Neurological: Epicritic and protective threshold grossly intact bilaterally.   Musculoskeletal Exam: Range of motion within normal limits to all pedal and ankle joints bilateral. Muscle strength 5/5 in all groups bilateral.   Assessment: 1. 5th metatarsal stress fracture right - based on clinical exam - resolved    Plan of Care:  1. Patient evaluated.  2. Recommended good shoe gear.  3. Continue taking Meloxicam as needed.  4. Return to clinic as needed.      Edrick Kins, DPM [facility]  Dr. Edrick Kins, DPM    [address], [place] [address]                Office [phone]  Fax [phone]

## 2018-08-03 ENCOUNTER — Ambulatory Visit
Admission: RE | Admit: 2018-08-03 | Discharge: 2018-08-03 | Disposition: A | Discharge status: Home / Self Care | Payer: 59 | Source: Ambulatory Visit | Attending: Family Medicine | Admitting: Family Medicine

## 2018-08-03 DIAGNOSIS — Z1231 Encounter for screening mammogram for malignant neoplasm of breast: Secondary | ICD-10-CM

## 2019-01-06 ENCOUNTER — Other Ambulatory Visit: Payer: Self-pay | Admitting: Obstetrics & Gynecology

## 2019-01-07 NOTE — Telephone Encounter (Signed)
Medication refill request: wellbutrin and effexor  Last AEX:  11/03/17 SM Next AEX: 02/15/19 SM Last MMG (if hormonal medication request): 08/03/18 BIRADS1:neg  Refill authorized: both 11/03/17 #90tabs/4R. Today #90/0R?

## 2019-02-15 ENCOUNTER — Ambulatory Visit: Payer: 59 | Admitting: Obstetrics & Gynecology

## 2019-02-26 ENCOUNTER — Encounter: Payer: Self-pay | Admitting: Obstetrics & Gynecology

## 2019-02-26 ENCOUNTER — Other Ambulatory Visit: Payer: Self-pay

## 2019-02-26 ENCOUNTER — Ambulatory Visit (INDEPENDENT_AMBULATORY_CARE_PROVIDER_SITE_OTHER): Payer: 59 | Admitting: Obstetrics & Gynecology

## 2019-02-26 VITALS — BP 106/76 | HR 72 | Temp 97.6°F | Ht 63.25 in | Wt 198.0 lb

## 2019-02-26 DIAGNOSIS — Z0000 Encounter for general adult medical examination without abnormal findings: Secondary | ICD-10-CM | POA: Diagnosis not present

## 2019-02-26 DIAGNOSIS — Z01411 Encounter for gynecological examination (general) (routine) with abnormal findings: Secondary | ICD-10-CM | POA: Diagnosis not present

## 2019-02-26 MED ORDER — BUPROPION HCL ER (XL) 300 MG PO TB24: 300.0000 mg | ORAL_TABLET | Freq: Every day | ORAL | 0 refills | Status: DC

## 2019-02-26 MED ORDER — VENLAFAXINE HCL ER 75 MG PO CP24: ORAL_CAPSULE | ORAL | 4 refills | Status: DC

## 2019-02-26 NOTE — Patient Instructions (Signed)
[facility]patient [facility] at [place],  [place]  [id number] [address]: [phone]  Sunday:Closed Monday:7:30 AM - 6:00 PM Tuesday:7:30 AM - 6:00 PM Wednesday:7:30 AM - 6:00 PM Thursday:7:30 AM - 6:00 PM Friday:7:30 AM - 6:00 PM Saturday:Closed

## 2019-02-26 NOTE — Progress Notes (Signed)
53 y.o. [id number] [name] here for annual exam.  Had stress fracture in right foot.  Thinks this came from walking on the treadmill.  Saw podiatry.  Did not have to wear a boot.  Was able to rest foot with Covid stay at home time.  Denies vaginal bleeding.    No LMP recorded. (Menstrual status: IUD).          Sexually active: Yes.    The current method of family planning is IUD. Mirena placed 07/04/16  Exercising: Yes.    some Smoker:  no  Health Maintenance: Pap:  11/03/17 Neg. HR HPV:neg   08/08/16 neg  History of abnormal Pap:  no MMG:  08/03/18 BIRADS1:Neg  Colonoscopy:  2016 normal. F/u 10 years.  Dr. Collene Mares. BMD:   Never TDaP:  Allergic  Pneumonia vaccine(s):  No Shingrix:   She is contemplating having this.  Hep C testing: n/a Screening Labs: fasting labs today    reports that she quit smoking about 26 years ago. She has a 5.00 pack-year smoking history. She has never used smokeless tobacco. She reports current alcohol use. She reports that she does not use drugs.  Past Medical History:  Diagnosis Date  . Depression   . Encounter for insertion of mirena IUD 06/29/11  . Pneumonia    childhood  . Stress fracture of ankle 07/2018   right     Past Surgical History:  Procedure Laterality Date  . CERVICAL DISC ARTHROPLASTY  2010  . CESAREAN SECTION  1996  . HYSTEROSCOPY N/A 07/04/2016   Procedure: HYSTEROSCOPY with removal of IUD;  Surgeon: Megan Salon, MD;  Location: [facility];  Service: Gynecology;  Laterality: N/A;  . INTRAUTERINE DEVICE (IUD) INSERTION N/A 07/04/2016   Procedure: INTRAUTERINE DEVICE (IUD) INSERTION;  Surgeon: Megan Salon, MD;  Location: [facility];  Service: Gynecology;  Laterality: N/A;  . INTRAUTERINE DEVICE INSERTION  07/05/11   Mirena  . MICRODISCECTOMY LUMBAR  02/2001   L4-5    Current Outpatient Medications  Medication Sig Dispense Refill  . buPROPion (WELLBUTRIN XL) 300 MG 24 hr tablet TAKE 1 TABLET BY MOUTH  DAILY 90 tablet 0   . cholecalciferol (VITAMIN D) 1000 units tablet Take 1,000 Units by mouth daily.    Marland Kitchen levonorgestrel (MIRENA) 20 MCG/24HR IUD 1 each by Intrauterine route once.    . venlafaxine XR (EFFEXOR-XR) 75 MG 24 hr capsule TAKE 1 CAPSULE BY MOUTH  DAILY WITH BREAKFAST 90 capsule 0   No current facility-administered medications for this visit.     Family History  Problem Relation Age of Onset  . Hypertension Mother   . Kidney cancer Mother   . Depression Father   . Kidney cancer Brother   . Depression Maternal Grandmother   . Melanoma Maternal Grandmother 80       mets to colon  . Kidney cancer Maternal Grandmother   . Breast cancer Neg Hx     Review of Systems  All other systems reviewed and are negative.   Exam:   BP 106/76   Pulse 72   Temp 97.6 F (36.4 C) (Temporal)   Ht 5' 3.25" (1.607 m)   Wt 198 lb (89.8 kg)   BMI 34.80 kg/m   Height: 5' 3.25" (160.7 cm)  Ht Readings from Last 3 Encounters:  02/26/19 5' 3.25" (1.607 m)  11/03/17 5' 3.25" (1.607 m)  08/08/16 5' 3.25" (1.607 m)    General appearance: alert, cooperative and appears stated  age Head: Normocephalic, without obvious abnormality, atraumatic Neck: no adenopathy, supple, symmetrical, trachea midline and thyroid normal to inspection and palpation Lungs: clear to auscultation bilaterally Breasts: normal appearance, no masses or tenderness Heart: regular rate and rhythm Abdomen: soft, non-tender; bowel sounds normal; no masses,  no organomegaly Extremities: extremities normal, atraumatic, no cyanosis or edema Skin: Skin color, texture, turgor normal. No rashes or lesions Lymph nodes: Cervical, supraclavicular, and axillary nodes normal. No abnormal inguinal nodes palpated Neurologic: Grossly normal   Pelvic: External genitalia:  no lesions              Urethra:  normal appearing urethra with no masses, tenderness or lesions              Bartholins and Skenes: normal                 Vagina: normal appearing  vagina with normal color and discharge, no lesions              Cervix: no lesions              Pap taken: No. Bimanual Exam:  Uterus:  enlarged, 10 weeks size, globular c/w known fibroids              Adnexa: no mass, fullness, tenderness               Rectovaginal: Confirms               Anus:  normal sphincter tone, no lesions  Chaperone was present for exam.  A:  Well Woman with normal exam Mirena IUD removed and replaced 07/04/2016 H/o depression H/o fibroids Vit D deficiency   P:   Mammogram guidelines reviewed.   pap smear with neg HR HPV obtained 2019.  Not indicated today RF for Wellbutrin 500mg  XL daily #90/4RF RF for Effexor XR 75mg  daily.  #90/4RF CBC, CMP, Lipids, TSH and Vit D obtained today Colonoscopy is UTD return annually or prn

## 2019-02-27 LAB — COMPREHENSIVE METABOLIC PANEL
ALT: 9 IU/L (ref 0–32)
AST: 10 IU/L (ref 0–40)
Albumin/Globulin Ratio: 1.6 (ref 1.2–2.2)
Albumin: 4.6 g/dL (ref 3.8–4.9)
Alkaline Phosphatase: 83 IU/L (ref 39–117)
BUN/Creatinine Ratio: 17 (ref 9–23)
BUN: 12 mg/dL (ref 6–24)
Bilirubin Total: <0.2 mg/dL (ref 0.0–1.2)
CO2: 25 mmol/L (ref 20–29)
Calcium: 9.8 mg/dL (ref 8.7–10.2)
Chloride: 102 mmol/L (ref 96–106)
Creatinine, Ser: 0.72 mg/dL (ref 0.57–1.00)
GFR calc Af Amer: 111 mL/min/{1.73_m2} (ref >59)
GFR calc non Af Amer: 97 mL/min/{1.73_m2} (ref >59)
Globulin, Total: 2.8 g/dL (ref 1.5–4.5)
Glucose: 84 mg/dL (ref 65–99)
Potassium: 4.5 mmol/L (ref 3.5–5.2)
Sodium: 142 mmol/L (ref 134–144)
Total Protein: 7.4 g/dL (ref 6.0–8.5)

## 2019-02-27 LAB — CBC
Hematocrit: 44.2 % (ref 34.0–46.6)
Hemoglobin: 14.2 g/dL (ref 11.1–15.9)
MCH: 29.3 pg (ref 26.6–33.0)
MCHC: 32.1 g/dL (ref 31.5–35.7)
MCV: 91 fL (ref 79–97)
Platelets: 352 10*3/uL (ref 150–450)
RBC: 4.85 x10E6/uL (ref 3.77–5.28)
RDW: 12.9 % (ref 11.7–15.4)
WBC: 7.3 10*3/uL (ref 3.4–10.8)

## 2019-02-27 LAB — LIPID PANEL
Chol/HDL Ratio: 3.9 ratio (ref 0.0–4.4)
Cholesterol, Total: 254 mg/dL — ABNORMAL HIGH (ref 100–199)
HDL: 65 mg/dL (ref >39)
LDL Chol Calc (NIH): 160 mg/dL — ABNORMAL HIGH (ref 0–99)
Triglycerides: 160 mg/dL — ABNORMAL HIGH (ref 0–149)
VLDL Cholesterol Cal: 29 mg/dL (ref 5–40)

## 2019-02-27 LAB — TSH: TSH: 2.54 u[IU]/mL (ref 0.450–4.500)

## 2019-02-27 LAB — VITAMIN D 25 HYDROXY (VIT D DEFICIENCY, FRACTURES): Vit D, 25-Hydroxy: 19.9 ng/mL — ABNORMAL LOW (ref 30.0–100.0)

## 2019-02-28 ENCOUNTER — Telehealth: Payer: Self-pay | Admitting: *Deleted

## 2019-02-28 NOTE — Telephone Encounter (Signed)
-----   Message from Megan Salon, MD sent at 02/27/2019  9:08 PM EDT ----- Please let pt know her CBC, TSH and metabolic panel were normal.  Vit D was 19.  Needs 50k weekly x 12 weeks and then level repeated.  Ok to placed lab order and rx.  Also, cholesterol was elevated at 254 and LDLs were 160.  This needs to be followed and rechecked in 1 year.  Dietary changes and weight loss as well as exercise would help lower this level.  I reviewed family hx and there is not a hx of heart disease in family.  Plan to repeat 1 year.  Does she want these forwarded to Dr. Justin Mend?

## 2019-02-28 NOTE — Telephone Encounter (Signed)
Called patient. Unable to leave message. Voicemail full.

## 2019-03-04 NOTE — Telephone Encounter (Signed)
Second attempt to call patient. Unable to leave message. Voicemail full.

## 2019-03-11 ENCOUNTER — Encounter: Payer: Self-pay | Admitting: *Deleted

## 2019-03-11 NOTE — Telephone Encounter (Signed)
Unable to reach letter sent to patient thru mail and MyChart.

## 2019-03-21 ENCOUNTER — Telehealth: Payer: Self-pay | Admitting: *Deleted

## 2019-03-21 DIAGNOSIS — E559 Vitamin D deficiency, unspecified: Secondary | ICD-10-CM

## 2019-03-21 MED ORDER — VITAMIN D (ERGOCALCIFEROL) 1.25 MG (50000 UNIT) PO CAPS: 50000.0000 [IU] | ORAL_CAPSULE | ORAL | 0 refills | Status: DC

## 2019-03-21 NOTE — Telephone Encounter (Signed)
Spoke with patient, advised of results as seen below per Dr. Sabra Heck. Rx for Vit D to verified pharmacy, patient read back instructions. Lab appt scheduled for 07/04/19 at 1:15pm. Patient verbalizes understanding and is agreeable. Copy of labs faxed via Epic to Dr. Justin Mend.    Notes recorded by Megan Salon, MD on 02/27/2019 at 9:08 PM EDT  Please let pt know her CBC, TSH and metabolic panel were normal. Vit D was 19. Needs 50k weekly x 12 weeks and then level repeated. Ok to placed lab order and rx. Also, cholesterol was elevated at 254 and LDLs were 160. This needs to be followed and rechecked in 1 year. Dietary changes and weight loss as well as exercise would help lower this level. I reviewed family hx and there is not a hx of heart disease in family. Plan to repeat 1 year. Does she want these forwarded to Dr. Justin Mend?   Routing to provider for final review. Patient is agreeable to disposition. Will close encounter.

## 2019-03-21 NOTE — Telephone Encounter (Signed)
Patient received a letter that the office has been trying to reach her.

## 2019-03-25 ENCOUNTER — Encounter (HOSPITAL_COMMUNITY): Payer: Self-pay | Admitting: Emergency Medicine

## 2019-03-25 ENCOUNTER — Emergency Department (HOSPITAL_COMMUNITY)
Admission: EM | Admit: 2019-03-25 | Discharge: 2019-03-25 | Disposition: A | Discharge status: Home / Self Care | Payer: 59 | Attending: Emergency Medicine | Admitting: Emergency Medicine

## 2019-03-25 ENCOUNTER — Emergency Department (HOSPITAL_COMMUNITY): Payer: 59

## 2019-03-25 DIAGNOSIS — R079 Chest pain, unspecified: Secondary | ICD-10-CM | POA: Insufficient documentation

## 2019-03-25 DIAGNOSIS — Z79899 Other long term (current) drug therapy: Secondary | ICD-10-CM | POA: Insufficient documentation

## 2019-03-25 DIAGNOSIS — Z87891 Personal history of nicotine dependence: Secondary | ICD-10-CM | POA: Insufficient documentation

## 2019-03-25 LAB — BASIC METABOLIC PANEL
Anion gap: 11 (ref 5–15)
BUN: 12 mg/dL (ref 6–20)
CO2: 24 mmol/L (ref 22–32)
Calcium: 9.9 mg/dL (ref 8.9–10.3)
Chloride: 102 mmol/L (ref 98–111)
Creatinine, Ser: 0.9 mg/dL (ref 0.44–1.00)
GFR calc Af Amer: >60 mL/min (ref >60)
GFR calc non Af Amer: >60 mL/min (ref >60)
Glucose, Bld: 98 mg/dL (ref 70–99)
Potassium: 4.1 mmol/L (ref 3.5–5.1)
Sodium: 137 mmol/L (ref 135–145)

## 2019-03-25 LAB — CBC
HCT: 45 % (ref 36.0–46.0)
Hemoglobin: 15.4 g/dL — ABNORMAL HIGH (ref 12.0–15.0)
MCH: 30.4 pg (ref 26.0–34.0)
MCHC: 34.2 g/dL (ref 30.0–36.0)
MCV: 88.8 fL (ref 80.0–100.0)
Platelets: 412 10*3/uL — ABNORMAL HIGH (ref 150–400)
RBC: 5.07 MIL/uL (ref 3.87–5.11)
RDW: 12.6 % (ref 11.5–15.5)
WBC: 8.1 10*3/uL (ref 4.0–10.5)
nRBC: 0 % (ref 0.0–0.2)

## 2019-03-25 LAB — TROPONIN I (HIGH SENSITIVITY)
Troponin I (High Sensitivity): <2 ng/L (ref <18)
Troponin I (High Sensitivity): <2 ng/L (ref <18)

## 2019-03-25 MED ORDER — OMEPRAZOLE 20 MG PO CPDR: 20.0000 mg | DELAYED_RELEASE_CAPSULE | Freq: Every day | ORAL | 0 refills | Status: DC

## 2019-03-25 NOTE — ED Provider Notes (Signed)
[facility] EMERGENCY DEPARTMENT Provider Note   CSN: [id number] Arrival date & time: 03/25/19  [id number]     History   Chief Complaint Chief Complaint  Patient presents with  . Chest Pain    HPI Heather Cochran is a 53 y.o. female.  HPI: A 53 year old patient with a history of hypercholesterolemia and obesity presents for evaluation of chest pain. Initial onset of pain was more than 6 hours ago. The patient's chest pain is described as heaviness/pressure/tightness and is not worse with exertion. The patient complains of nausea. The patient's chest pain is middle- or left-sided, is not well-localized, is not sharp and does not radiate to the arms/jaw/neck. The patient denies diaphoresis. The patient has no history of stroke, has no history of peripheral artery disease, has not smoked in the past 90 days, denies any history of treated diabetes, has no relevant family history of coronary artery disease (first degree relative at less than age 28) and is not hypertensive.  Most recent pain was yesterday. Nausea associated with pain. Feeling well now.   Past Medical History:  Diagnosis Date  . Depression   . Encounter for insertion of mirena IUD 06/29/11  . Pneumonia    childhood  . Stress fracture of ankle 07/2018   right     Patient Active Problem List   Diagnosis Date Noted  . Depression 07/05/2013  . Anxiety state, unspecified 07/05/2013  . Fibroid 07/05/2013    Past Surgical History:  Procedure Laterality Date  . CERVICAL DISC ARTHROPLASTY  2010  . CESAREAN SECTION  1996  . HYSTEROSCOPY N/A 07/04/2016   Procedure: HYSTEROSCOPY with removal of IUD;  Surgeon: Megan Salon, MD;  Location: [facility];  Service: Gynecology;  Laterality: N/A;  . INTRAUTERINE DEVICE (IUD) INSERTION N/A 07/04/2016   Procedure: INTRAUTERINE DEVICE (IUD) INSERTION;  Surgeon: Megan Salon, MD;  Location: [facility];  Service: Gynecology;  Laterality: N/A;  . INTRAUTERINE DEVICE INSERTION   07/05/11   Mirena  . MICRODISCECTOMY LUMBAR  02/2001   L4-5     OB History    Gravida  3   Para  2   Term  2   Preterm      AB  1   Living  2     SAB      TAB  1   Ectopic      Multiple      Live Births  2            Home Medications    Prior to Admission medications   Medication Sig Start Date End Date Taking? Authorizing Provider  buPROPion (WELLBUTRIN XL) 300 MG 24 hr tablet Take 1 tablet (300 mg total) by mouth daily. 02/26/19  Yes Megan Salon, MD  levonorgestrel [facility]) 20 MCG/24HR IUD 1 each by Intrauterine route once.   Yes [provider]  venlafaxine XR (EFFEXOR-XR) 75 MG 24 hr capsule TAKE 1 CAPSULE BY MOUTH  DAILY WITH BREAKFAST Patient taking differently: Take 75 mg by mouth daily.  02/26/19  Yes Megan Salon, MD  Vitamin D, Ergocalciferol, (DRISDOL) 1.25 MG (50000 UT) CAPS capsule Take 1 capsule (50,000 Units total) by mouth every 7 (seven) days. 03/21/19  Yes Megan Salon, MD  omeprazole (PRILOSEC) 20 MG capsule Take 1 capsule (20 mg total) by mouth daily for 14 days. 03/25/19 04/08/19  Romona Curls, MD    Family History Family History  Problem Relation Age of Onset  . Hypertension  Mother   . Kidney cancer Mother   . Depression Father   . Kidney cancer Brother   . Depression Maternal Grandmother   . Melanoma Maternal Grandmother 80       mets to colon  . Kidney cancer Maternal Grandmother   . Breast cancer Neg Hx     Social History Social History   Tobacco Use  . Smoking status: Former Smoker    Packs/day: 1.00    Years: 5.00    Pack years: 5.00    Quit date: 06/06/1992    Years since quitting: 26.8  . Smokeless tobacco: Never Used  Substance Use Topics  . Alcohol use: Yes    Alcohol/week: 0.0 - 1.0 standard drinks  . Drug use: No     Allergies   Tetanus toxoids   Review of Systems Review of Systems  Constitutional: Negative for chills and fever.  HENT: Negative for ear pain and sore throat.   Eyes:  Negative for pain and visual disturbance.  Respiratory: Negative for cough and shortness of breath.   Cardiovascular: Positive for chest pain. Negative for palpitations and leg swelling.  Gastrointestinal: Negative for abdominal pain and vomiting.  Genitourinary: Negative for dysuria and hematuria.  Musculoskeletal: Negative for arthralgias and back pain.  Skin: Negative for color change and rash.  Neurological: Negative for seizures and syncope.  All other systems reviewed and are negative.    Physical Exam Updated Vital Signs BP 131/86   Pulse 75   Temp 98.5 F (36.9 C) (Oral)   Resp 16   Ht 5\' 4"  (1.626 m)   Wt 88.9 kg   SpO2 97%   BMI 33.64 kg/m   Physical Exam Vitals signs and nursing note reviewed.  Constitutional:      General: She is not in acute distress.    Appearance: She is well-developed.  HENT:     Head: Normocephalic and atraumatic.  Eyes:     Conjunctiva/sclera: Conjunctivae normal.  Neck:     Musculoskeletal: Neck supple.  Cardiovascular:     Rate and Rhythm: Normal rate and regular rhythm.     Heart sounds: No murmur.  Pulmonary:     Effort: Pulmonary effort is normal. No respiratory distress.     Breath sounds: Normal breath sounds.  Abdominal:     Palpations: Abdomen is soft. There is no mass.     Tenderness: There is no abdominal tenderness. There is no guarding or rebound.  Skin:    General: Skin is warm and dry.     Capillary Refill: Capillary refill takes less than 2 seconds.  Neurological:     General: No focal deficit present.     Mental Status: She is alert.      ED Treatments / Results  Labs (all labs ordered are listed, but only abnormal results are displayed) Labs Reviewed  CBC - Abnormal; Notable for the following components:      Result Value   Hemoglobin 15.4 (*)    Platelets 412 (*)    All other components within normal limits  BASIC METABOLIC PANEL  TROPONIN I (HIGH SENSITIVITY)  TROPONIN I (HIGH SENSITIVITY)     EKG EKG Interpretation  Date/Time:  Monday March 25 2019 09:35:41 EDT Ventricular Rate:  76 PR Interval:  128 QRS Duration: 82 QT Interval:  364 QTC Calculation: 409 R Axis:   83 Text Interpretation:  Normal sinus rhythm Normal ECG Confirmed by Carmin Muskrat [phone]) on 03/25/2019 9:44:43 AM   Radiology Dg Chest  2 View  Result Date: 03/25/2019 CLINICAL DATA:  Chest pain and nausea EXAM: CHEST - 2 VIEW COMPARISON:  None. FINDINGS: The heart size and mediastinal contours are within normal limits. Both lungs are clear. The visualized skeletal structures show postsurgical changes in the cervical spine. IMPRESSION: No active cardiopulmonary disease. Electronically Signed   By: Inez Catalina M.D.   On: 03/25/2019 09:57    Procedures Procedures (including critical care time)  Medications Ordered in ED Medications - No data to display   Initial Impression / Assessment and Plan / ED Course  I have reviewed the triage vital signs and the nursing notes.  Pertinent labs & imaging results that were available during my care of the patient were reviewed by me and considered in my medical decision making (see chart for details).     HEAR Score: 3  Patient is a 53 year old female with a history and physical exam presents to the emergency department for evaluation of chest pain which has been intermittent but she has experienced it previously several weeks ago as well.  Chest pain seems to be occurring later in the day predominantly.  Labs and imaging were obtained in the emergency department for evaluation of her chest pain.  Labs demonstrated troponin less than 2.  Since patient's chest pain has not been present since yesterday there is no need to obtain a second troponin at this time.  Hemoglobin of 15.4.  White blood cell count within normal limits at 8.1.  No other emergent findings.  Given patient's description of the pain this could represent GERD.  Patient was recommended to follow-up  with primary care physician and was given a prescription for omeprazole.  Patient was given my usual and customary discussion regarding chest pain including anticipatory guidance and strict return precautions.  She verbalized understanding and agreement with the plan.  Patient was discharged in stable condition.  I saw this patient in conjunction with Dr. Vanita Panda who verbalized agreement with the plan. Final Clinical Impressions(s) / ED Diagnoses   Final diagnoses:  Chest pain, unspecified type    ED Discharge Orders         Ordered    omeprazole (PRILOSEC) 20 MG capsule  Daily     03/25/19 1220           Romona Curls, MD 03/25/19 1749    Carmin Muskrat, MD 03/26/19 2151

## 2019-03-25 NOTE — ED Notes (Signed)
Patient verbalizes understanding of discharge instructions . Opportunity for questions and answers were provided . Armband removed by staff ,Pt discharged from ED. W/C  offered at D/C  and Declined W/C at D/C and was escorted to lobby by RN.  

## 2019-03-25 NOTE — ED Notes (Signed)
Patient transported to X-ray 

## 2019-03-25 NOTE — ED Triage Notes (Signed)
Patient c/o chest pain and nausea over this weekend. Pain is intermittent and last for about 30-45 mins. Describes it as pressure and mid chest. States she just had her annual exam and informed cholesterol was high.

## 2019-05-11 ENCOUNTER — Other Ambulatory Visit: Payer: Self-pay | Admitting: Obstetrics & Gynecology

## 2019-05-13 NOTE — Telephone Encounter (Signed)
Medication refill request: Wellbutrin  Last AEX:  02-26-2019 SM  Next AEX: 05-08-20 Last MMG (if hormonal medication request): n/a Refill authorized: Today, please advise.   Medication pended for #90,3RF. Please refill if appropriate.

## 2019-07-04 ENCOUNTER — Other Ambulatory Visit: Payer: Self-pay | Admitting: Obstetrics & Gynecology

## 2019-07-04 ENCOUNTER — Other Ambulatory Visit (INDEPENDENT_AMBULATORY_CARE_PROVIDER_SITE_OTHER): Payer: 59

## 2019-07-04 ENCOUNTER — Other Ambulatory Visit: Payer: Self-pay | Admitting: Family Medicine

## 2019-07-04 ENCOUNTER — Other Ambulatory Visit: Payer: Self-pay

## 2019-07-04 DIAGNOSIS — Z1231 Encounter for screening mammogram for malignant neoplasm of breast: Secondary | ICD-10-CM

## 2019-07-04 DIAGNOSIS — E559 Vitamin D deficiency, unspecified: Secondary | ICD-10-CM

## 2019-07-05 LAB — VITAMIN D 25 HYDROXY (VIT D DEFICIENCY, FRACTURES): Vit D, 25-Hydroxy: 27.7 ng/mL — ABNORMAL LOW (ref 30.0–100.0)

## 2019-08-09 ENCOUNTER — Other Ambulatory Visit: Payer: Self-pay

## 2019-08-09 ENCOUNTER — Ambulatory Visit
Admission: RE | Admit: 2019-08-09 | Discharge: 2019-08-09 | Disposition: A | Discharge status: Home / Self Care | Payer: 59 | Source: Ambulatory Visit | Attending: Obstetrics & Gynecology | Admitting: Obstetrics & Gynecology

## 2019-08-09 DIAGNOSIS — Z1231 Encounter for screening mammogram for malignant neoplasm of breast: Secondary | ICD-10-CM

## 2019-09-02 IMAGING — MG DIGITAL SCREENING BILATERAL MAMMOGRAM WITH CAD
4 series · 4 of 4 positions shown · non-contrast
Comparison: Previous exam(s).

CLINICAL DATA: Screening.

EXAM:
DIGITAL SCREENING BILATERAL MAMMOGRAM WITH CAD

[L MLO]
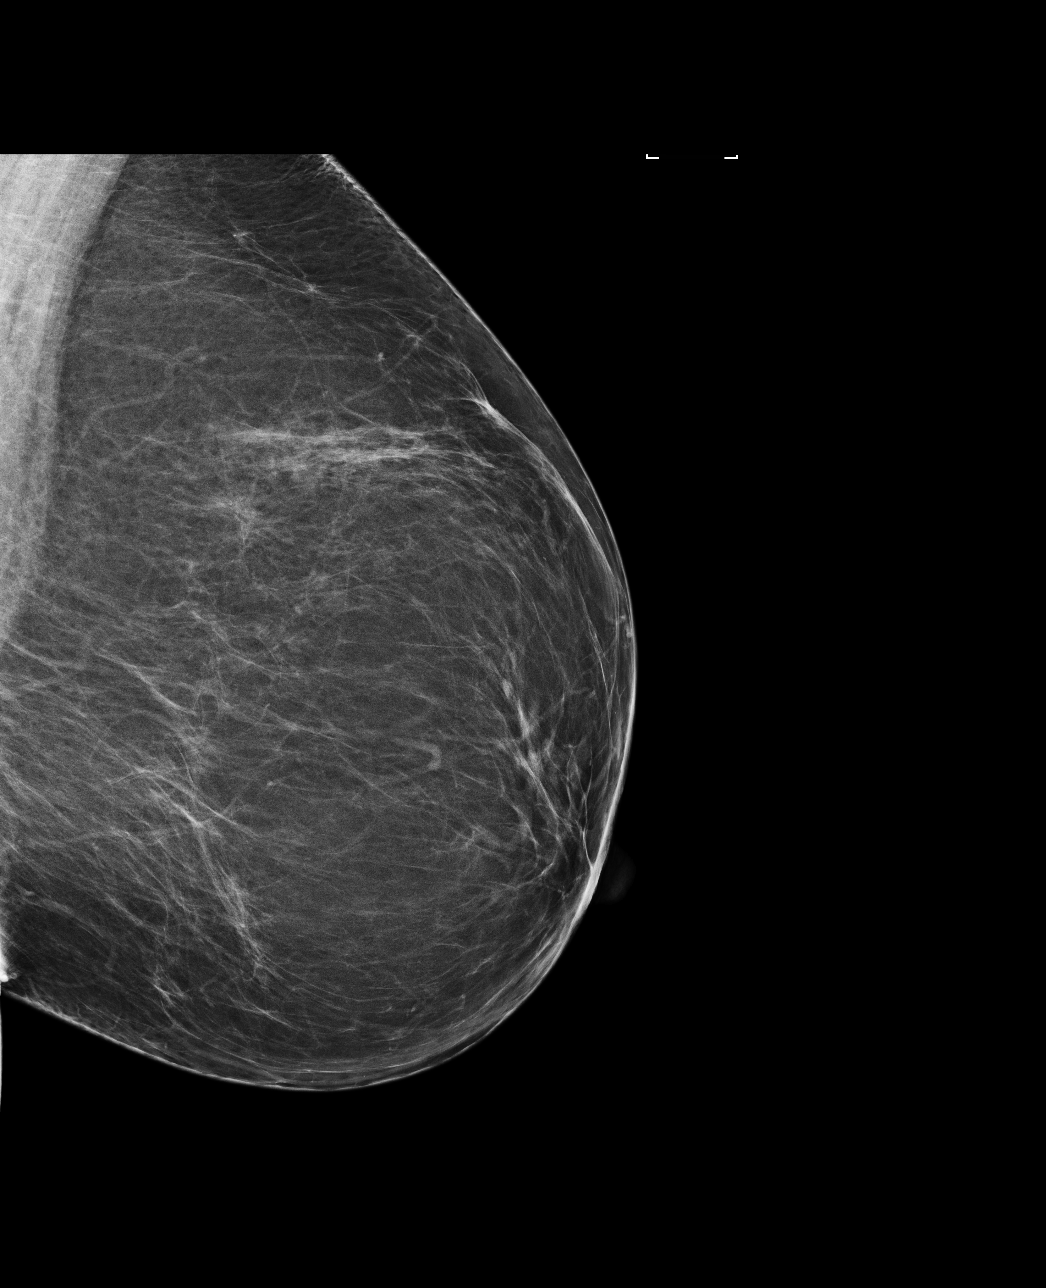

[R CC]
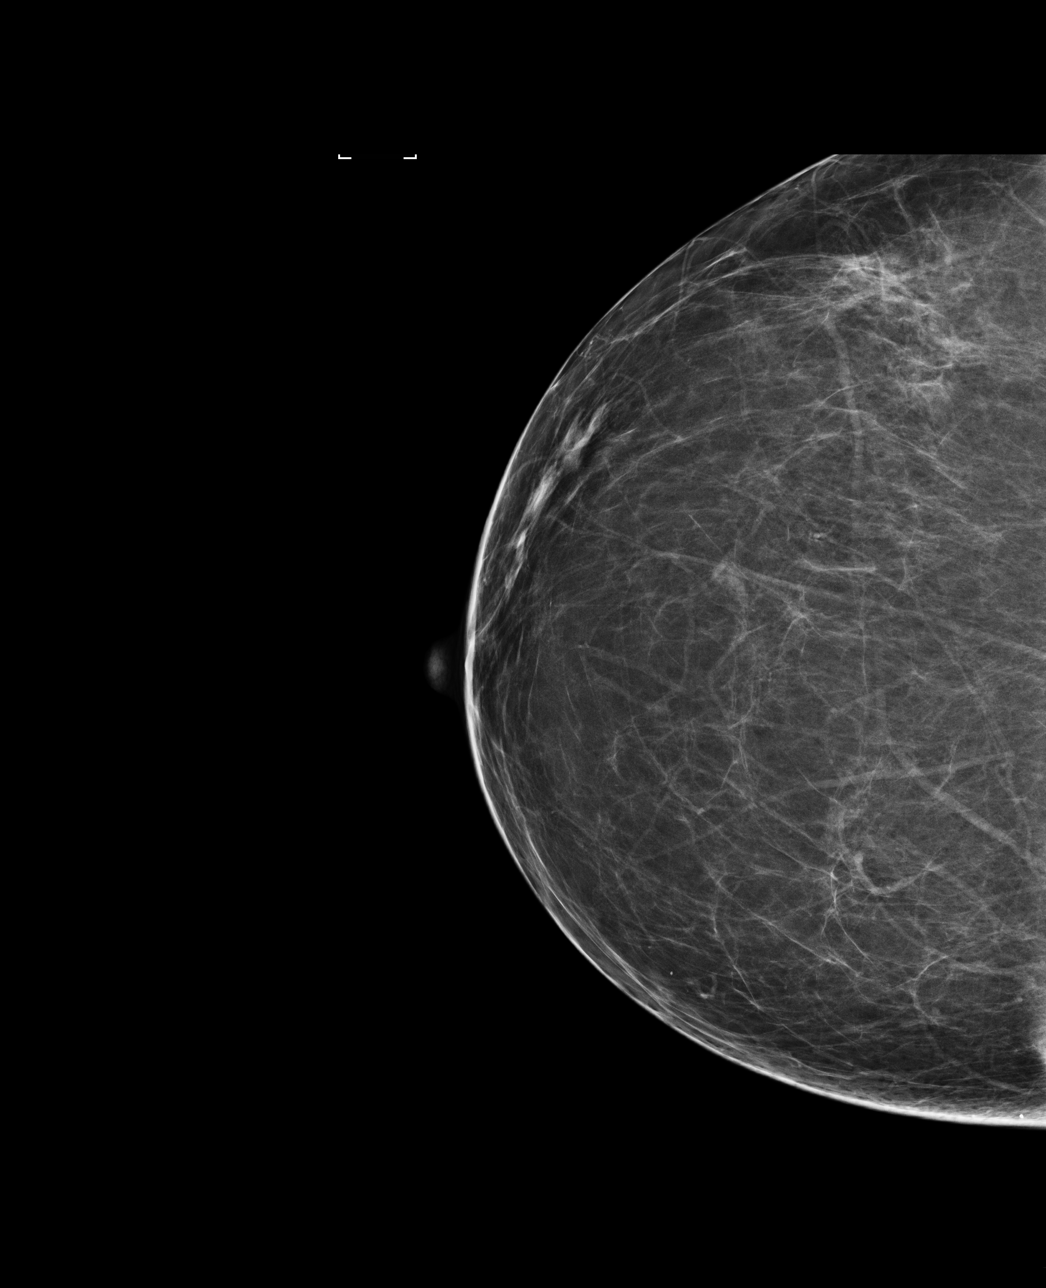

[R MLO]
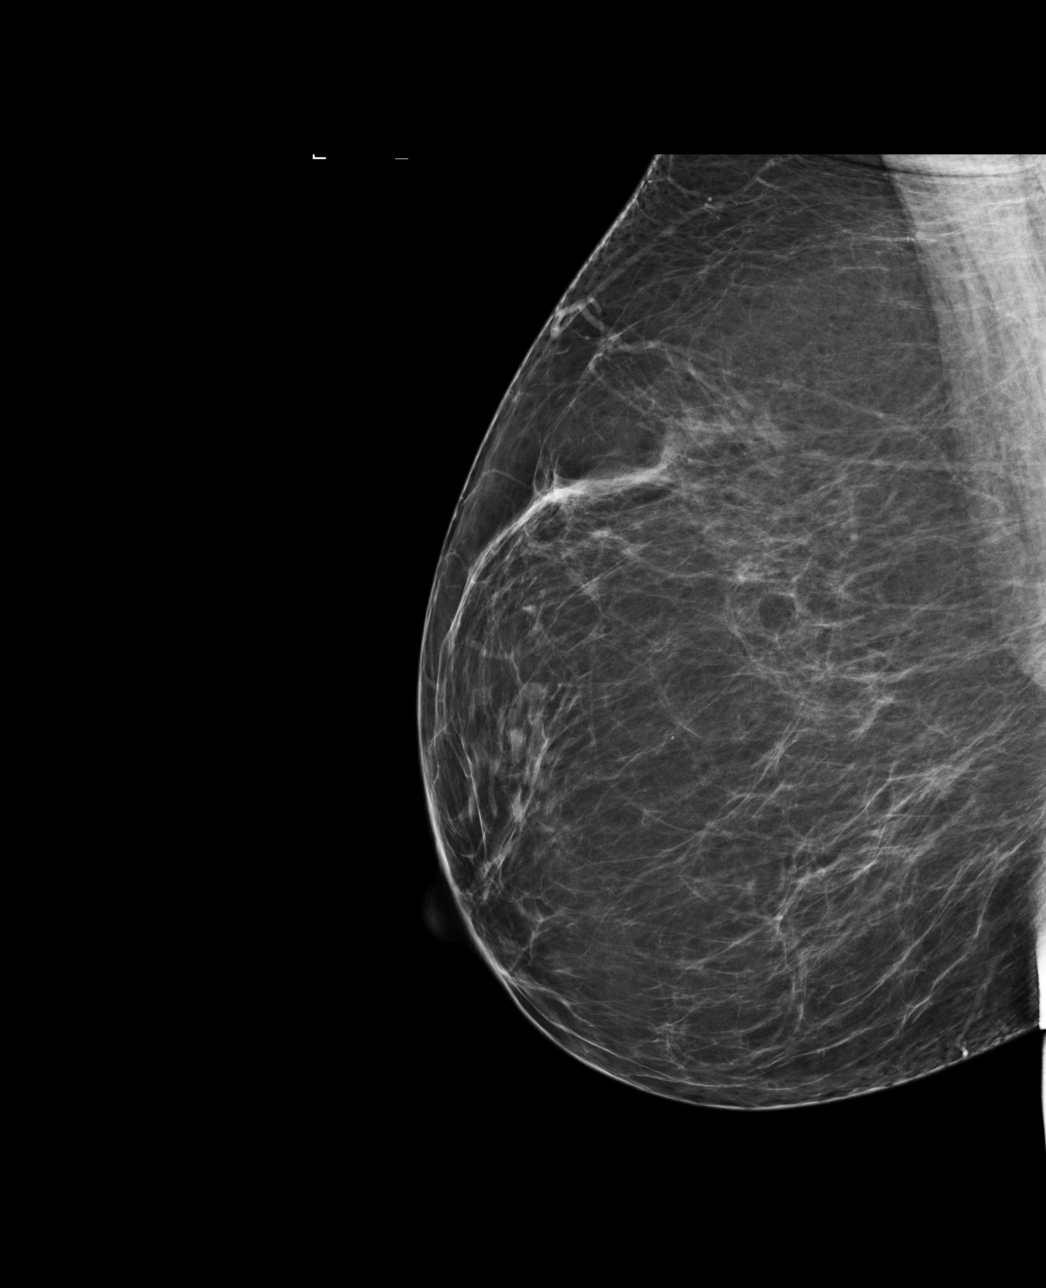

[L CC]
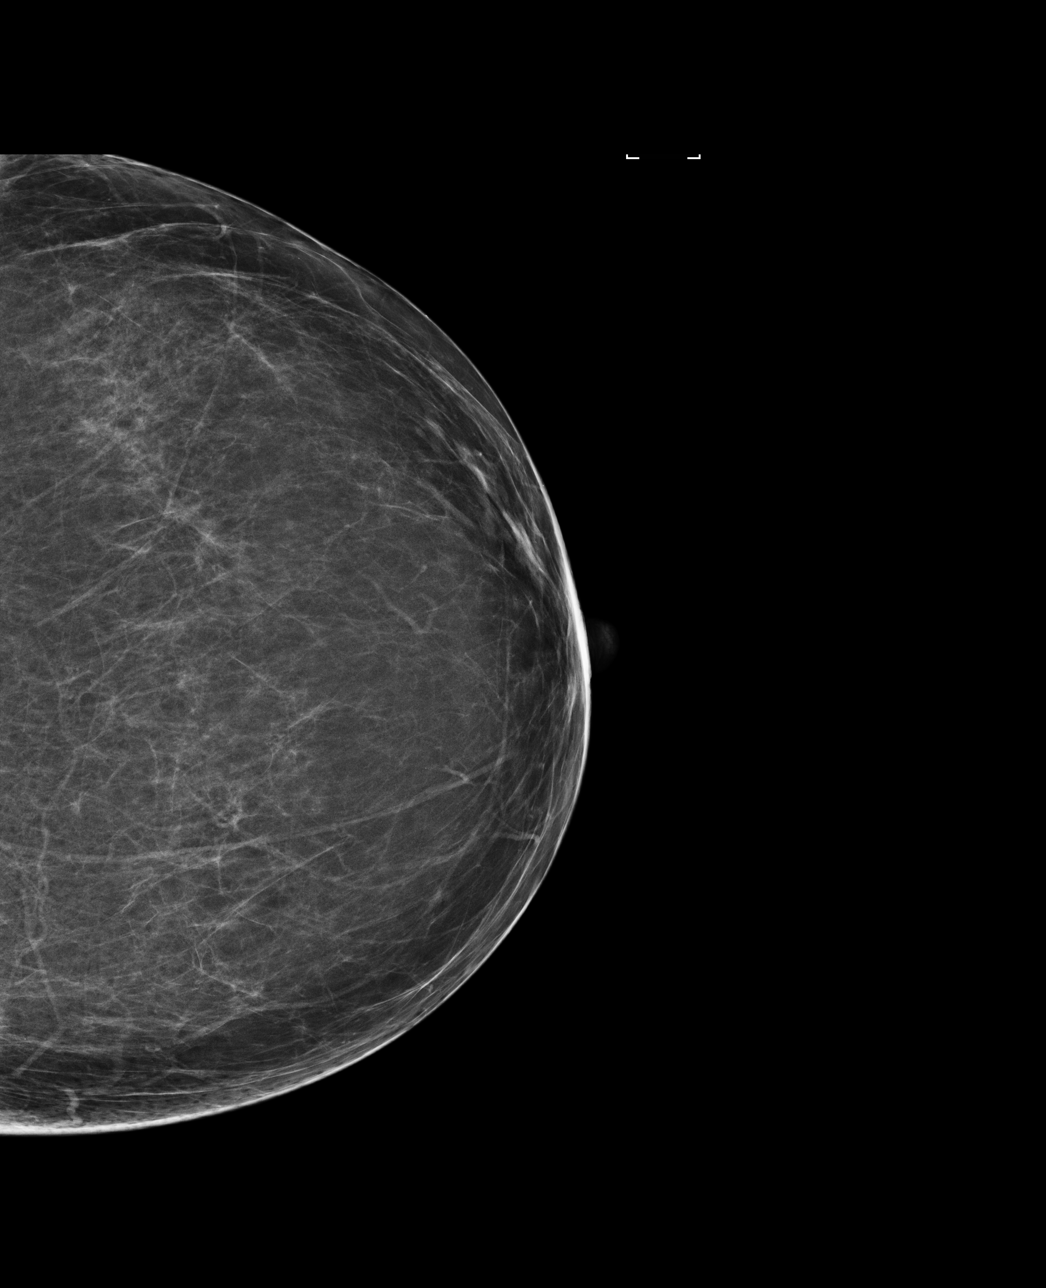

[4 of 4 positions shown; findings below may reference images not displayed]

ACR Breast Density Category b: There are scattered areas of
fibroglandular density.
FINDINGS: There are no findings suspicious for malignancy. Images were
processed with CAD.
IMPRESSION: No mammographic evidence of malignancy. A result letter of this
screening mammogram will be mailed directly to the patient.

RECOMMENDATION:
Screening mammogram in one year. (Code:AS-G-LCT)

BI-RADS CATEGORY  1: Negative.

## 2020-04-23 IMAGING — DX DG CHEST 2V
2 series · 2 of 2 positions shown · non-contrast
Comparison: None.

CLINICAL DATA: Chest pain and nausea

EXAM:
CHEST - 2 VIEW

[chest lat]
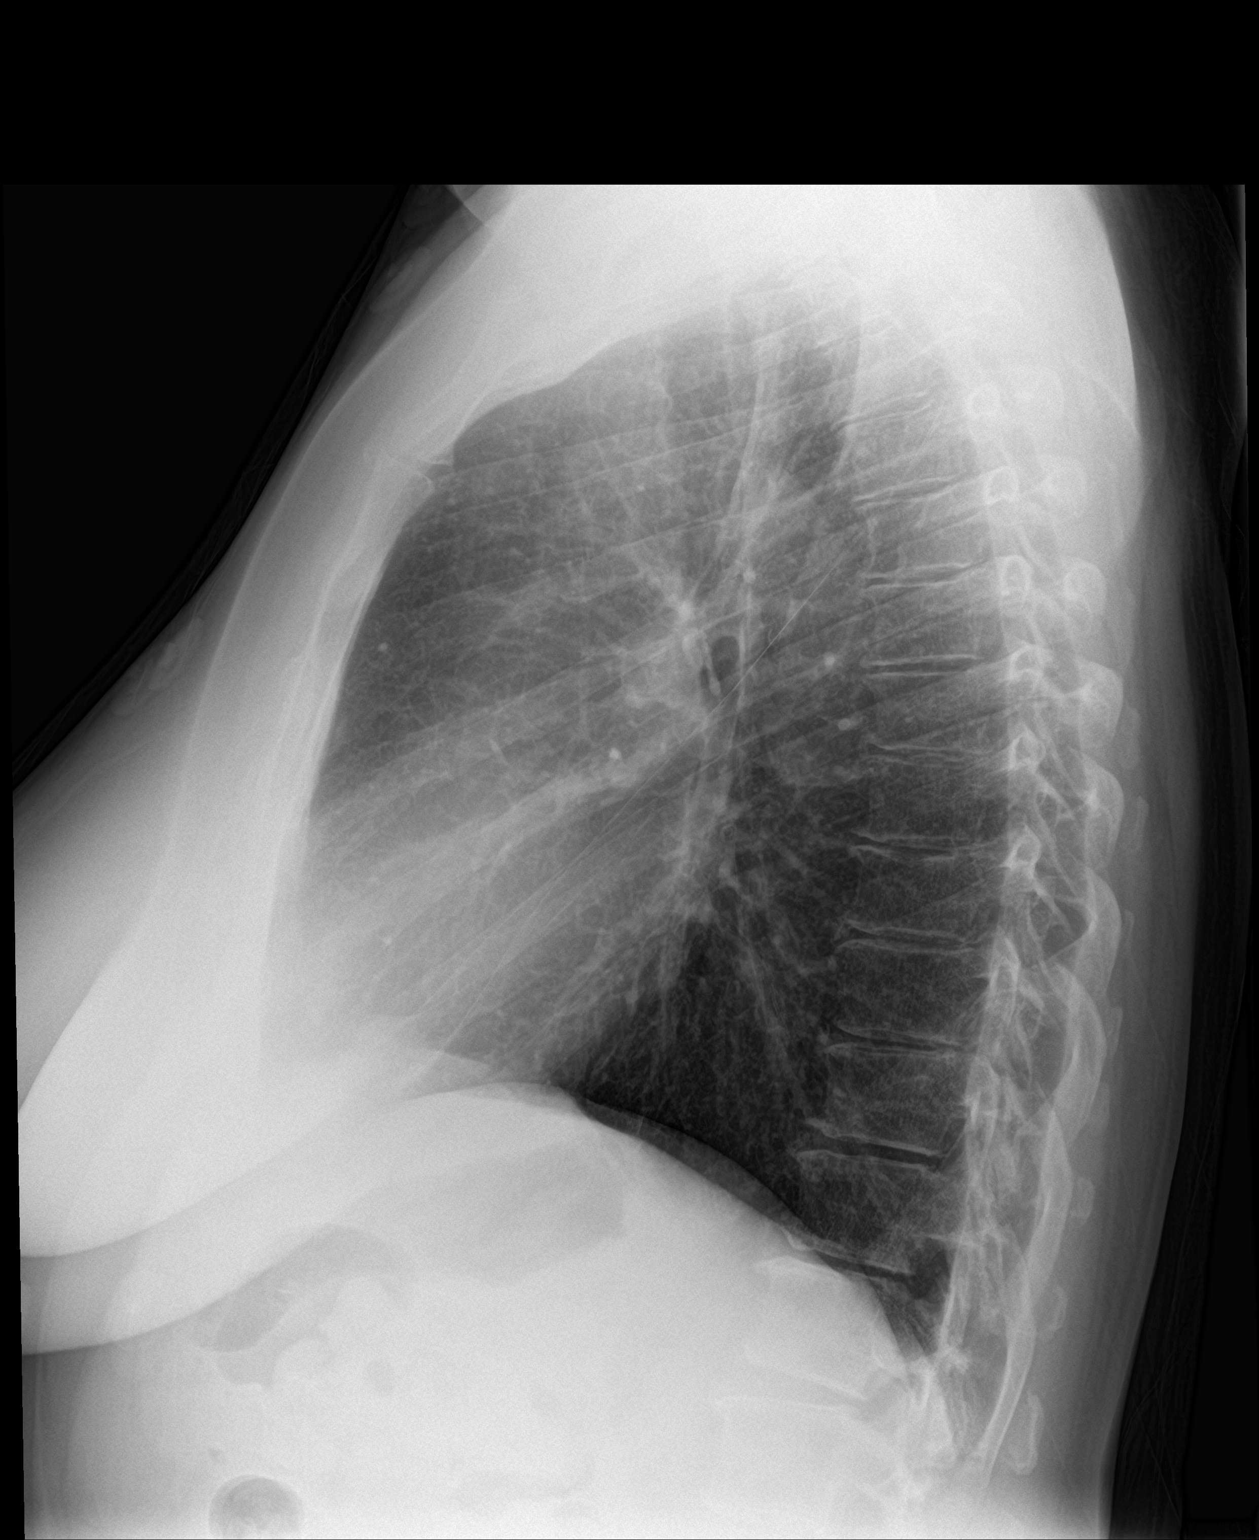

[chest pa]
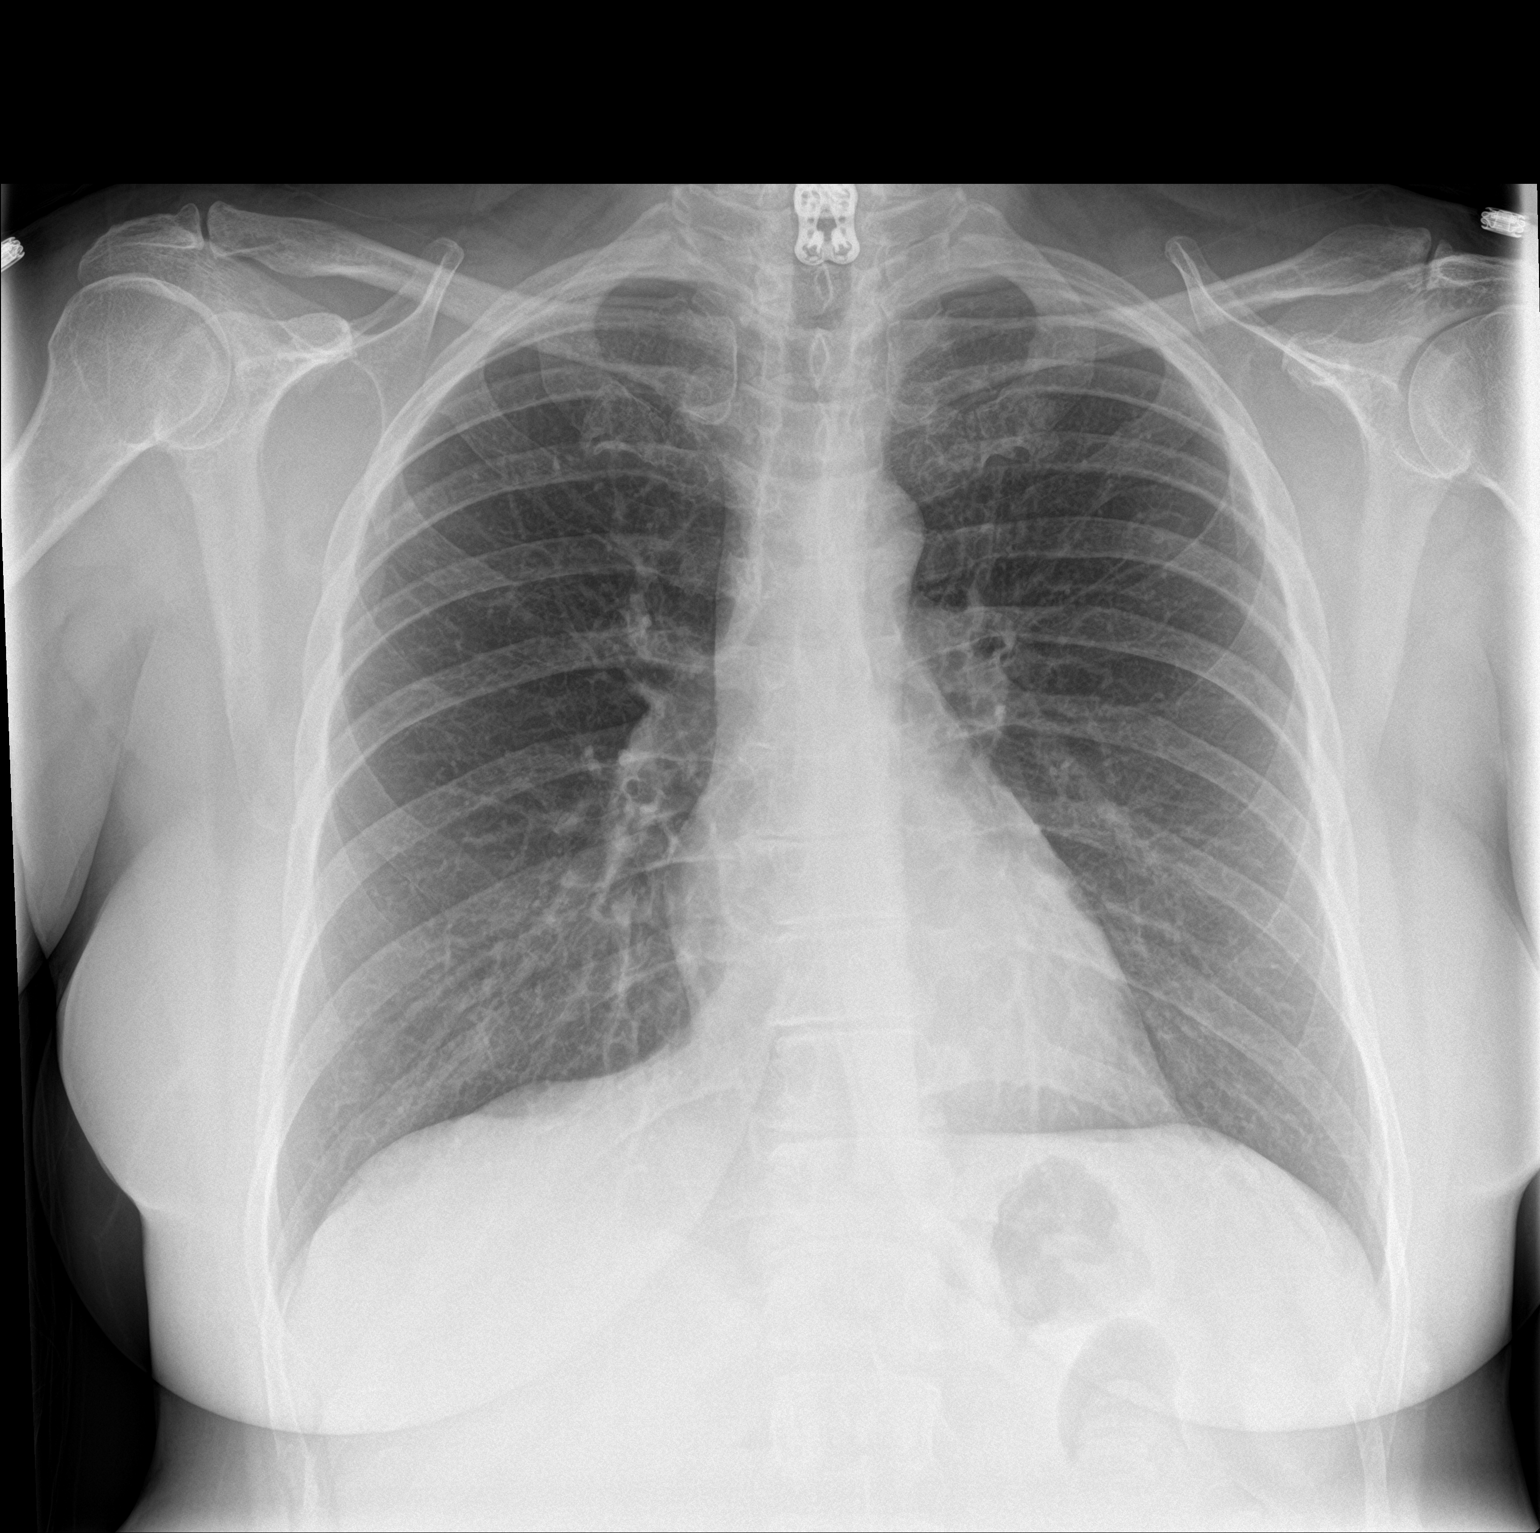

[2 of 2 positions shown; findings below may reference images not displayed]

FINDINGS: The heart size and mediastinal contours are within normal limits.
Both lungs are clear. The visualized skeletal structures show
postsurgical changes in the cervical spine.
IMPRESSION: No active cardiopulmonary disease.

## 2020-05-08 ENCOUNTER — Ambulatory Visit: Payer: 59

## 2020-06-06 ENCOUNTER — Other Ambulatory Visit: Payer: Self-pay | Admitting: Obstetrics & Gynecology

## 2020-06-07 NOTE — Telephone Encounter (Signed)
Patient is overdue for an annual exam. 3 month supply of medication sent to give her enough time to make an appointment.

## 2020-07-10 ENCOUNTER — Other Ambulatory Visit: Payer: Self-pay | Admitting: Obstetrics & Gynecology

## 2020-07-10 DIAGNOSIS — Z1231 Encounter for screening mammogram for malignant neoplasm of breast: Secondary | ICD-10-CM

## 2020-07-18 ENCOUNTER — Other Ambulatory Visit: Payer: Self-pay | Admitting: Obstetrics and Gynecology

## 2020-07-22 ENCOUNTER — Other Ambulatory Visit: Payer: Self-pay | Admitting: Obstetrics and Gynecology

## 2020-08-02 ENCOUNTER — Other Ambulatory Visit: Payer: Self-pay | Admitting: Obstetrics and Gynecology

## 2020-08-05 ENCOUNTER — Other Ambulatory Visit: Payer: Self-pay | Admitting: Obstetrics and Gynecology

## 2020-08-26 ENCOUNTER — Ambulatory Visit
Admission: RE | Admit: 2020-08-26 | Discharge: 2020-08-26 | Disposition: A | Discharge status: Home / Self Care | Payer: 59 | Source: Ambulatory Visit | Attending: Obstetrics & Gynecology | Admitting: Obstetrics & Gynecology

## 2020-08-26 ENCOUNTER — Other Ambulatory Visit: Payer: Self-pay

## 2020-08-26 DIAGNOSIS — Z1231 Encounter for screening mammogram for malignant neoplasm of breast: Secondary | ICD-10-CM

## 2020-10-08 ENCOUNTER — Other Ambulatory Visit: Payer: Self-pay | Admitting: Obstetrics & Gynecology

## 2020-10-29 ENCOUNTER — Other Ambulatory Visit (HOSPITAL_COMMUNITY)
Admission: RE | Admit: 2020-10-29 | Discharge: 2020-10-29 | Disposition: A | Discharge status: Home / Self Care | Payer: 59 | Source: Ambulatory Visit | Attending: Obstetrics & Gynecology | Admitting: Obstetrics & Gynecology

## 2020-10-29 ENCOUNTER — Other Ambulatory Visit: Payer: Self-pay

## 2020-10-29 ENCOUNTER — Encounter (HOSPITAL_BASED_OUTPATIENT_CLINIC_OR_DEPARTMENT_OTHER): Payer: Self-pay | Admitting: Obstetrics & Gynecology

## 2020-10-29 ENCOUNTER — Ambulatory Visit (INDEPENDENT_AMBULATORY_CARE_PROVIDER_SITE_OTHER): Payer: 59 | Admitting: Obstetrics & Gynecology

## 2020-10-29 VITALS — BP 126/72 | HR 67 | Ht 63.0 in | Wt 190.0 lb

## 2020-10-29 DIAGNOSIS — N912 Amenorrhea, unspecified: Secondary | ICD-10-CM

## 2020-10-29 DIAGNOSIS — E559 Vitamin D deficiency, unspecified: Secondary | ICD-10-CM | POA: Diagnosis not present

## 2020-10-29 DIAGNOSIS — E7800 Pure hypercholesterolemia, unspecified: Secondary | ICD-10-CM | POA: Diagnosis not present

## 2020-10-29 DIAGNOSIS — Z1159 Encounter for screening for other viral diseases: Secondary | ICD-10-CM

## 2020-10-29 DIAGNOSIS — Z01419 Encounter for gynecological examination (general) (routine) without abnormal findings: Secondary | ICD-10-CM

## 2020-10-29 DIAGNOSIS — D219 Benign neoplasm of connective and other soft tissue, unspecified: Secondary | ICD-10-CM

## 2020-10-29 DIAGNOSIS — Z124 Encounter for screening for malignant neoplasm of cervix: Secondary | ICD-10-CM

## 2020-10-29 DIAGNOSIS — Z975 Presence of (intrauterine) contraceptive device: Secondary | ICD-10-CM

## 2020-10-29 NOTE — Progress Notes (Deleted)
55 y.o. [id number] [name] here for annual exam.    No LMP recorded. (Menstrual status: IUD).          Sexually active: {yes no:314532}  The current method of family planning is {contraception:315051}.    Exercising: {yes no:314532}  {types:19826} Smoker:  {YES [id number]  Health Maintenance: Pap: 11/03/2017 History of abnormal Pap:  {YES NO:22349} MMG:  08/26/2020 Colonoscopy:  *** BMD:   *** TDaP:  *** Pneumonia vaccine(s):  *** Shingrix:   *** Hep C testing: *** Screening Labs: ***   reports that she quit smoking about 28 years ago. She has a 5.00 pack-year smoking history. She has never used smokeless tobacco. She reports current alcohol use. She reports that she does not use drugs.  Past Medical History:  Diagnosis Date  . Depression   . Encounter for insertion of mirena IUD 06/29/11  . Pneumonia    childhood  . Stress fracture of ankle 07/2018   right     Past Surgical History:  Procedure Laterality Date  . CERVICAL DISC ARTHROPLASTY  2010  . CESAREAN SECTION  1996  . HYSTEROSCOPY N/A 07/04/2016   Procedure: HYSTEROSCOPY with removal of IUD;  Surgeon: Megan Salon, MD;  Location: [facility];  Service: Gynecology;  Laterality: N/A;  . INTRAUTERINE DEVICE (IUD) INSERTION N/A 07/04/2016   Procedure: INTRAUTERINE DEVICE (IUD) INSERTION;  Surgeon: Megan Salon, MD;  Location: [facility];  Service: Gynecology;  Laterality: N/A;  . INTRAUTERINE DEVICE INSERTION  07/05/11   Mirena  . MICRODISCECTOMY LUMBAR  02/2001   L4-5    Current Outpatient Medications  Medication Sig Dispense Refill  . buPROPion (WELLBUTRIN XL) 300 MG 24 hr tablet TAKE 1 TABLET BY MOUTH  DAILY 90 tablet 3  . levonorgestrel (MIRENA) 20 MCG/24HR IUD 1 each by Intrauterine route once.    Marland Kitchen omeprazole (PRILOSEC) 20 MG capsule Take 1 capsule (20 mg total) by mouth daily for 14 days. 14 capsule 0  . venlafaxine XR (EFFEXOR-XR) 75 MG 24 hr capsule TAKE 1 CAPSULE BY MOUTH  DAILY 90 capsule 0   . Vitamin D, Ergocalciferol, (DRISDOL) 1.25 MG (50000 UT) CAPS capsule Take 1 capsule (50,000 Units total) by mouth every 7 (seven) days. 12 capsule 0   No current facility-administered medications for this visit.    Family History  Problem Relation Age of Onset  . Hypertension Mother   . Kidney cancer Mother   . Depression Father   . Kidney cancer Brother   . Depression Maternal Grandmother   . Melanoma Maternal Grandmother 80       mets to colon  . Kidney cancer Maternal Grandmother   . Breast cancer Neg Hx     Review of Systems  Exam:   There were no vitals taken for this visit.     General appearance: alert, cooperative and appears stated age Head: Normocephalic, without obvious abnormality, atraumatic Neck: no adenopathy, supple, symmetrical, trachea midline and thyroid {EXAM; THYROID:18604} Lungs: clear to auscultation bilaterally Breasts: {Exam; breast:13139::"normal appearance, no masses or tenderness"} Heart: regular rate and rhythm Abdomen: soft, non-tender; bowel sounds normal; no masses,  no organomegaly Extremities: extremities normal, atraumatic, no cyanosis or edema Skin: Skin color, texture, turgor normal. No rashes or lesions Lymph nodes: Cervical, supraclavicular, and axillary nodes normal. No abnormal inguinal nodes palpated Neurologic: Grossly normal   Pelvic: External genitalia:  no lesions              Urethra:  normal appearing  urethra with no masses, tenderness or lesions              Bartholins and Skenes: normal                 Vagina: normal appearing vagina with normal color and no discharge, no lesions              Cervix: {exam; cervix:14595}              Pap taken: {yes no:314532} Bimanual Exam:  Uterus:  {exam; uterus:12215}              Adnexa: {exam; adnexa:12223}               Rectovaginal: Confirms               Anus:  normal sphincter tone, no lesions  Chaperone, ***, CMA, was present for exam.  Assessment/Plan:

## 2020-10-29 NOTE — Progress Notes (Signed)
55 y.o. [id number] [name] here for annual exam.  Doing well.  Denies vaginal bleeding.  Will need IUD removed next year.  Placed 2015.  No complaints today.  No LMP recorded. (Menstrual status: IUD).          Sexually active: Yes.    The current method of family planning is IUD.    Smoker:  no  Health Maintenance: Pap:  10/2017 neg with neg HR HPV History of abnormal Pap:  no MMG:  08/2020  Colonoscopy:  2016, Dr. Collene Mares BMD:   Not indicated TDaP:  Allergic so does not recieved Pneumonia vaccine(s):   Not indicated yet Shingrix:   discussed Hep C testing: will order today Screening Labs: will order today   reports that she quit smoking about 28 years ago. She has a 5.00 pack-year smoking history. She has never used smokeless tobacco. She reports current alcohol use. She reports that she does not use drugs.  Past Medical History:  Diagnosis Date  . Depression   . Encounter for insertion of mirena IUD 06/29/11  . Pneumonia    childhood  . Stress fracture of ankle 07/2018   right     Past Surgical History:  Procedure Laterality Date  . CERVICAL DISC ARTHROPLASTY  2010  . CESAREAN SECTION  1996  . HYSTEROSCOPY N/A 07/04/2016   Procedure: HYSTEROSCOPY with removal of IUD;  Surgeon: Megan Salon, MD;  Location: [facility];  Service: Gynecology;  Laterality: N/A;  . INTRAUTERINE DEVICE (IUD) INSERTION N/A 07/04/2016   Procedure: INTRAUTERINE DEVICE (IUD) INSERTION;  Surgeon: Megan Salon, MD;  Location: [facility];  Service: Gynecology;  Laterality: N/A;  . INTRAUTERINE DEVICE INSERTION  07/05/11   Mirena  . MICRODISCECTOMY LUMBAR  02/2001   L4-5    Current Outpatient Medications  Medication Sig Dispense Refill  . buPROPion (WELLBUTRIN XL) 300 MG 24 hr tablet TAKE 1 TABLET BY MOUTH  DAILY 90 tablet 3  . levonorgestrel (MIRENA) 20 MCG/24HR IUD 1 each by Intrauterine route once.    . venlafaxine XR (EFFEXOR-XR) 75 MG 24 hr capsule TAKE 1 CAPSULE BY MOUTH  DAILY 90  capsule 0   No current facility-administered medications for this visit.    Family History  Problem Relation Age of Onset  . Hypertension Mother   . Kidney cancer Mother   . Depression Father   . Kidney cancer Brother   . Depression Maternal Grandmother   . Melanoma Maternal Grandmother 80       mets to colon  . Kidney cancer Maternal Grandmother   . Breast cancer Neg Hx     Review of Systems  All other systems reviewed and are negative.   Exam:   BP 126/72   Pulse 67   Ht 5\' 3"  (1.6 m)   Wt 190 lb (86.2 kg)   BMI 33.66 kg/m   Height: 5\' 3"  (160 cm)  General appearance: alert, cooperative and appears stated age Head: Normocephalic, without obvious abnormality, atraumatic Neck: no adenopathy, supple, symmetrical, trachea midline and thyroid normal to inspection and palpation Lungs: clear to auscultation bilaterally Breasts: normal appearance, no masses or tenderness Heart: regular rate and rhythm Abdomen: soft, non-tender; bowel sounds normal; no masses,  no organomegaly Extremities: extremities normal, atraumatic, no cyanosis or edema Skin: Skin color, texture, turgor normal. No rashes or lesions Lymph nodes: Cervical, supraclavicular, and axillary nodes normal. No abnormal inguinal nodes palpated Neurologic: Grossly normal   Pelvic: External genitalia:  no lesions  Urethra:  normal appearing urethra with no masses, tenderness or lesions              Bartholins and Skenes: normal                 Vagina: normal appearing vagina with normal color and no discharge, no lesions              Cervix: no lesions, no IUD string noted              Pap taken: Yes.   Bimanual Exam:  Uterus:  normal size, contour, position, consistency, mobility, non-tender              Adnexa: normal adnexa and no mass, fullness, tenderness               Rectovaginal: Confirms               Anus:  normal sphincter tone, no lesions  Chaperone, Octaviano Batty, CMA, was present for  exam.  Assessment/Plan: 1. Well woman exam with routine gynecological exam - pap and HR HPV obtained today - MMG 08/2020 - colonoscopy 2016 with Dr. Collene Mares, follow up 10 years - discussed when to start BMD - lab work ordered - vaccines updated  2. Amenorrhea - FSH; Future  3. Vitamin D deficiency - VITAMIN D 25 Hydroxy (Vit-D Deficiency, Fractures); Future  4. Elevated LDL cholesterol level - Lipid panel; Future  5. Encounter for hepatitis C screening test for low risk patient - Hepatitis C antibody; Future  6. Fibroid  7.  IUD in place - after [facility] results, will plan what will be done next year.

## 2020-10-30 DIAGNOSIS — E7800 Pure hypercholesterolemia, unspecified: Secondary | ICD-10-CM | POA: Insufficient documentation

## 2020-10-30 DIAGNOSIS — Z975 Presence of (intrauterine) contraceptive device: Secondary | ICD-10-CM | POA: Insufficient documentation

## 2020-10-30 DIAGNOSIS — E559 Vitamin D deficiency, unspecified: Secondary | ICD-10-CM | POA: Insufficient documentation

## 2020-11-03 LAB — CYTOLOGY - PAP
Comment: NEGATIVE
Diagnosis: NEGATIVE
High risk HPV: NEGATIVE

## 2020-11-04 ENCOUNTER — Other Ambulatory Visit: Payer: Self-pay

## 2020-11-04 ENCOUNTER — Other Ambulatory Visit (HOSPITAL_BASED_OUTPATIENT_CLINIC_OR_DEPARTMENT_OTHER)
Admission: RE | Admit: 2020-11-04 | Discharge: 2020-11-04 | Disposition: A | Discharge status: Home / Self Care | Payer: 59 | Source: Ambulatory Visit | Attending: Obstetrics & Gynecology | Admitting: Obstetrics & Gynecology

## 2020-11-04 DIAGNOSIS — E7800 Pure hypercholesterolemia, unspecified: Secondary | ICD-10-CM | POA: Insufficient documentation

## 2020-11-04 DIAGNOSIS — Z1159 Encounter for screening for other viral diseases: Secondary | ICD-10-CM | POA: Diagnosis present

## 2020-11-04 DIAGNOSIS — E559 Vitamin D deficiency, unspecified: Secondary | ICD-10-CM | POA: Insufficient documentation

## 2020-11-04 DIAGNOSIS — N912 Amenorrhea, unspecified: Secondary | ICD-10-CM | POA: Diagnosis present

## 2020-11-04 LAB — VITAMIN D 25 HYDROXY (VIT D DEFICIENCY, FRACTURES): Vit D, 25-Hydroxy: 23.35 ng/mL — ABNORMAL LOW (ref 30–100)

## 2020-11-04 LAB — LIPID PANEL
Cholesterol: 275 mg/dL — ABNORMAL HIGH (ref 0–200)
HDL: 72 mg/dL (ref >40)
LDL Cholesterol: 180 mg/dL — ABNORMAL HIGH (ref 0–99)
Total CHOL/HDL Ratio: 3.8 RATIO
Triglycerides: 114 mg/dL (ref <150)
VLDL: 23 mg/dL (ref 0–40)

## 2020-11-04 LAB — HEPATITIS C ANTIBODY: HCV Ab: NONREACTIVE

## 2020-11-05 ENCOUNTER — Encounter (HOSPITAL_BASED_OUTPATIENT_CLINIC_OR_DEPARTMENT_OTHER): Payer: Self-pay

## 2020-11-05 LAB — FOLLICLE STIMULATING HORMONE: FSH: 101 m[IU]/mL

## 2020-11-12 NOTE — Progress Notes (Signed)
LMOM @9 :21 for patient to call office to receive lab results and recommendations. tbw

## 2020-12-26 ENCOUNTER — Other Ambulatory Visit: Payer: Self-pay | Admitting: Obstetrics & Gynecology

## 2020-12-28 NOTE — Telephone Encounter (Signed)
Dr Sabra Heck, Verifying that you are the one refilling this medication.  Pt recently seen for annual exam with no mention of follow up for this.  Please advise ok to refill for a year.  Thanks Sharrie Rothman CMA

## 2021-01-29 ENCOUNTER — Other Ambulatory Visit (HOSPITAL_BASED_OUTPATIENT_CLINIC_OR_DEPARTMENT_OTHER): Payer: Self-pay | Admitting: Obstetrics & Gynecology

## 2021-01-29 ENCOUNTER — Other Ambulatory Visit: Payer: Self-pay | Admitting: Obstetrics & Gynecology

## 2021-07-29 ENCOUNTER — Other Ambulatory Visit: Payer: Self-pay | Admitting: Obstetrics & Gynecology

## 2021-07-29 DIAGNOSIS — Z1231 Encounter for screening mammogram for malignant neoplasm of breast: Secondary | ICD-10-CM

## 2021-08-27 ENCOUNTER — Ambulatory Visit
Admission: RE | Admit: 2021-08-27 | Discharge: 2021-08-27 | Disposition: A | Discharge status: Home / Self Care | Payer: 59 | Source: Ambulatory Visit | Attending: Obstetrics & Gynecology | Admitting: Obstetrics & Gynecology

## 2021-08-27 DIAGNOSIS — Z1231 Encounter for screening mammogram for malignant neoplasm of breast: Secondary | ICD-10-CM

## 2021-12-22 ENCOUNTER — Ambulatory Visit (INDEPENDENT_AMBULATORY_CARE_PROVIDER_SITE_OTHER): Payer: 59 | Admitting: Obstetrics & Gynecology

## 2021-12-22 ENCOUNTER — Encounter (HOSPITAL_BASED_OUTPATIENT_CLINIC_OR_DEPARTMENT_OTHER): Payer: Self-pay | Admitting: Obstetrics & Gynecology

## 2021-12-22 VITALS — BP 122/67 | HR 66 | Ht 63.75 in | Wt 179.8 lb

## 2021-12-22 DIAGNOSIS — Z30432 Encounter for removal of intrauterine contraceptive device: Secondary | ICD-10-CM | POA: Diagnosis not present

## 2021-12-22 DIAGNOSIS — Z780 Asymptomatic menopausal state: Secondary | ICD-10-CM

## 2021-12-22 DIAGNOSIS — Z8639 Personal history of other endocrine, nutritional and metabolic disease: Secondary | ICD-10-CM

## 2021-12-22 DIAGNOSIS — Z01419 Encounter for gynecological examination (general) (routine) without abnormal findings: Secondary | ICD-10-CM | POA: Diagnosis not present

## 2021-12-22 DIAGNOSIS — Z0000 Encounter for general adult medical examination without abnormal findings: Secondary | ICD-10-CM | POA: Diagnosis not present

## 2021-12-22 DIAGNOSIS — D219 Benign neoplasm of connective and other soft tissue, unspecified: Secondary | ICD-10-CM

## 2021-12-22 NOTE — Progress Notes (Signed)
56 y.o. [id number] [name] here for annual exam.  Doing well.  Denies vaginal bleeding.    Seeing psychiatrist now.  Medication has been changed a little bit.  This has been good.  Had [facility] last year which was elevated.  Will plan IUD removal today.  Consent obtained.  No LMP recorded. (Menstrual status: IUD).          Sexually active: Yes.    The current method of family planning is IUD.    Smoker:  no  Health Maintenance: Pap:  10/29/2020 Negative, neg HR HPV History of abnormal Pap:  no MMG:  08/27/2021 Negative Colonoscopy:  2016, Dr. Collene Mares BMD:  not indicated yet Screening Labs: ordered today   reports that she quit smoking about 29 years ago. Her smoking use included cigarettes. She has a 5.00 pack-year smoking history. She has never used smokeless tobacco. She reports current alcohol use. She reports that she does not use drugs.  Past Medical History:  Diagnosis Date   Depression    Encounter for insertion of mirena IUD 06/29/11   Pneumonia    childhood   Stress fracture of ankle 07/2018   right     Past Surgical History:  Procedure Laterality Date   CERVICAL DISC ARTHROPLASTY  2010   [place]   HYSTEROSCOPY N/A 07/04/2016   Procedure: HYSTEROSCOPY with removal of IUD;  Surgeon: Megan Salon, MD;  Location: [facility];  Service: Gynecology;  Laterality: N/A;   INTRAUTERINE DEVICE (IUD) INSERTION N/A 07/04/2016   Procedure: INTRAUTERINE DEVICE (IUD) INSERTION;  Surgeon: Megan Salon, MD;  Location: [facility];  Service: Gynecology;  Laterality: N/A;   INTRAUTERINE DEVICE INSERTION  07/05/11   Mirena   MICRODISCECTOMY LUMBAR  02/2001   L4-5    Current Outpatient Medications  Medication Sig Dispense Refill   buPROPion (WELLBUTRIN XL) 300 MG 24 hr tablet TAKE 1 TABLET BY MOUTH  DAILY 90 tablet 3   levonorgestrel (MIRENA) 20 MCG/24HR IUD 1 each by Intrauterine route once.     venlafaxine XR (EFFEXOR-XR) 75 MG 24 hr capsule TAKE 1 CAPSULE BY  MOUTH  DAILY (Patient taking differently: 150 mg daily with breakfast.) 90 capsule 3   No current facility-administered medications for this visit.    Family History  Problem Relation Age of Onset   Hypertension Mother    Kidney cancer Mother    Depression Father    Kidney cancer Brother    Depression Maternal Grandmother    Melanoma Maternal Grandmother 80       mets to colon   Kidney cancer Maternal Grandmother    Breast cancer Neg Hx    Gyn: Genitourinary:negative  Exam:   BP 122/67 (BP Location: Right Arm, Patient Position: Sitting, Cuff Size: Large)   Pulse 66   Ht 5' 3.75" (1.619 m) Comment: Reported  Wt 179 lb 12.8 oz (81.6 kg)   BMI 31.11 kg/m   Height: 5' 3.75" (161.9 cm) (Reported)  General appearance: alert, cooperative and appears stated age Head: Normocephalic, without obvious abnormality, atraumatic Neck: no adenopathy, supple, symmetrical, trachea midline and thyroid normal to inspection and palpation Lungs: clear to auscultation bilaterally Breasts: normal appearance, no masses or tenderness Heart: regular rate and rhythm Abdomen: soft, non-tender; bowel sounds normal; no masses,  no organomegaly Extremities: extremities normal, atraumatic, no cyanosis or edema Skin: Skin color, texture, turgor normal. No rashes or lesions Lymph nodes: Cervical, supraclavicular, and axillary nodes normal. No abnormal inguinal nodes  palpated Neurologic: Grossly normal   Pelvic: External genitalia:  no lesions              Urethra:  normal appearing urethra with no masses, tenderness or lesions              Bartholins and Skenes: normal                 Vagina: normal appearing vagina with normal color and no discharge, no lesions              Cervix: no lesions, IUD string easily seen              Pap taken: No. Bimanual Exam:  Uterus:  enlarged, 10 weeks weeks size              Adnexa: normal adnexa               Rectovaginal: Confirms               Anus:  normal  sphincter tone, no lesions  Procedure:  Cervix visualized with placement of speculum.  IUD string noted.  IUD string grasped with ringed forceps and IUD removed easily and with one pull.  Pt tolerated procedure well.  No bleeding noted.  IUD discarded.    Chaperone, Octaviano Batty, CMA, was present for exam.  Assessment/Plan: 1. Well woman exam with routine gynecological exam - Pap smear 10/29/2020 - Mammogram 08/27/2021 - Colonoscopy 2016 with Dr. Collene Mares - Bone mineral density not indicated - lab work done done with PCP - vaccines reviewed/updated  2. H/O elevated lipids - Lipid panel - Comprehensive metabolic panel - Hemoglobin A1c  3. Blood tests for routine general physical examination - CBC - TSH  4. Menopause  5. Encounter for IUD removal - IUD removed without difficulty today  6. Fibroid - uterus smaller on exam today

## 2021-12-23 LAB — LIPID PANEL
Chol/HDL Ratio: 2.9 ratio (ref 0.0–4.4)
Cholesterol, Total: 287 mg/dL — ABNORMAL HIGH (ref 100–199)
HDL: 100 mg/dL (ref >39)
LDL Chol Calc (NIH): 171 mg/dL — ABNORMAL HIGH (ref 0–99)
Triglycerides: 100 mg/dL (ref 0–149)
VLDL Cholesterol Cal: 16 mg/dL (ref 5–40)

## 2021-12-23 LAB — COMPREHENSIVE METABOLIC PANEL
ALT: 7 IU/L (ref 0–32)
AST: 11 IU/L (ref 0–40)
Albumin/Globulin Ratio: 1.8 (ref 1.2–2.2)
Albumin: 4.8 g/dL (ref 3.8–4.9)
Alkaline Phosphatase: 78 IU/L (ref 44–121)
BUN/Creatinine Ratio: 11 (ref 9–23)
BUN: 9 mg/dL (ref 6–24)
Bilirubin Total: 0.3 mg/dL (ref 0.0–1.2)
CO2: 23 mmol/L (ref 20–29)
Calcium: 9.9 mg/dL (ref 8.7–10.2)
Chloride: 100 mmol/L (ref 96–106)
Creatinine, Ser: 0.81 mg/dL (ref 0.57–1.00)
Globulin, Total: 2.7 g/dL (ref 1.5–4.5)
Glucose: 85 mg/dL (ref 70–99)
Potassium: 5.2 mmol/L (ref 3.5–5.2)
Sodium: 139 mmol/L (ref 134–144)
Total Protein: 7.5 g/dL (ref 6.0–8.5)
eGFR: 86 mL/min/{1.73_m2} (ref >59)

## 2021-12-23 LAB — CBC
Hematocrit: 44.4 % (ref 34.0–46.6)
Hemoglobin: 15 g/dL (ref 11.1–15.9)
MCH: 30.5 pg (ref 26.6–33.0)
MCHC: 33.8 g/dL (ref 31.5–35.7)
MCV: 90 fL (ref 79–97)
Platelets: 423 10*3/uL (ref 150–450)
RBC: 4.92 x10E6/uL (ref 3.77–5.28)
RDW: 12.8 % (ref 11.7–15.4)
WBC: 7.6 10*3/uL (ref 3.4–10.8)

## 2021-12-23 LAB — TSH: TSH: 0.782 u[IU]/mL (ref 0.450–4.500)

## 2021-12-23 LAB — HEMOGLOBIN A1C

## 2022-01-04 ENCOUNTER — Other Ambulatory Visit (HOSPITAL_BASED_OUTPATIENT_CLINIC_OR_DEPARTMENT_OTHER): Payer: Self-pay | Admitting: Obstetrics & Gynecology

## 2022-01-04 DIAGNOSIS — E785 Hyperlipidemia, unspecified: Secondary | ICD-10-CM

## 2022-01-05 ENCOUNTER — Telehealth (HOSPITAL_BASED_OUTPATIENT_CLINIC_OR_DEPARTMENT_OTHER): Payer: Self-pay | Admitting: *Deleted

## 2022-01-05 NOTE — Telephone Encounter (Signed)
DPR reviewed. LMOVM that referral to PCP at [facility] has been placed. Provided with phone number. Advised to call with any questions.

## 2022-02-03 ENCOUNTER — Encounter: Payer: Self-pay | Admitting: Physician Assistant

## 2022-02-03 ENCOUNTER — Ambulatory Visit (INDEPENDENT_AMBULATORY_CARE_PROVIDER_SITE_OTHER): Payer: 59 | Admitting: Physician Assistant

## 2022-02-03 ENCOUNTER — Other Ambulatory Visit: Payer: Self-pay | Admitting: Physician Assistant

## 2022-02-03 VITALS — BP 110/70 | HR 57 | Temp 98.0°F | Ht 63.0 in | Wt 178.4 lb

## 2022-02-03 DIAGNOSIS — E7800 Pure hypercholesterolemia, unspecified: Secondary | ICD-10-CM | POA: Diagnosis not present

## 2022-02-03 DIAGNOSIS — Z8051 Family history of malignant neoplasm of kidney: Secondary | ICD-10-CM

## 2022-02-03 DIAGNOSIS — Z23 Encounter for immunization: Secondary | ICD-10-CM

## 2022-02-03 DIAGNOSIS — F32A Depression, unspecified: Secondary | ICD-10-CM | POA: Diagnosis not present

## 2022-02-03 NOTE — Patient Instructions (Addendum)
It was great to see you!  I will order the calcium score to check out the plaque burden around your heart. I will be in touch with this result as well as what the genetics counselor says regarding your kidney cancer family history.  Take care,  Inda Coke PA-C

## 2022-02-03 NOTE — Progress Notes (Signed)
Heather Cochran is a 56 y.o. female here for a new problem.  History of Present Illness:   Chief Complaint  Patient presents with   Establish Care   Hyperlipidemia    Pt was referred her by Dr.Miller for cholesterol    HPI  Depression with Anxiety Sees a psych NP. Currently taking effexor xr 150 mg, wellbutrin 300 mg xl daily, and she is about to start abilify 2 mg daily. She also sees a talk therapist.   Kidney cancer Has family history of kidney cancer in mother, brother and maternal grandmother. Saw urology when she was much younger when having recurrent UTIs. She had what sounds like a cystoscopy. Denies any routine screening or follow-up.  HLD Was referred to [place] by her gyn due to elevated LDL. She has a high HDL of 100 but most recent LDL is 171. She has rare alcohol intake. She does not exercise. She smoked for about 5 years. She has had a brother with MI in his 31's but reports that he had poor lifestyle.   The 10-year ASCVD risk score (Arnett DK, et al., 2019) is: 1.2%   Values used to calculate the score:     Age: 56 years     Sex: Female     Is Non-Hispanic African American: No     Diabetic: No     Tobacco smoker: No     Systolic Blood Pressure: 607 mmHg     Is BP treated: No     HDL Cholesterol: 100 mg/dL     Total Cholesterol: 287 mg/dL  Family history Elevated cl     Past Medical History:  Diagnosis Date   Allergy    Anxiety    Depression    Encounter for insertion of mirena IUD 06/29/2011   GERD (gastroesophageal reflux disease)    Hyperlipidemia    Stress fracture of ankle 07/2018   right      Social History   Tobacco Use   Smoking status: Former    Packs/day: 1.00    Years: 5.00    Total pack years: 5.00    Types: Cigarettes    Quit date: 06/06/1992    Years since quitting: 29.6   Smokeless tobacco: Never  Vaping Use   Vaping Use: Never used  Substance Use Topics   Alcohol use: Yes    Alcohol/week: 0.0 - 1.0 standard drinks of alcohol    Drug use: Never    Past Surgical History:  Procedure Laterality Date   CERVICAL DISC ARTHROPLASTY  06/06/2008   CESAREAN SECTION  06/06/1994   HYSTEROSCOPY N/A 07/04/2016   Procedure: HYSTEROSCOPY with removal of IUD;  Surgeon: Megan Salon, MD;  Location: [facility];  Service: Gynecology;  Laterality: N/A;   INTRAUTERINE DEVICE (IUD) INSERTION N/A 07/04/2016   Procedure: INTRAUTERINE DEVICE (IUD) INSERTION;  Surgeon: Megan Salon, MD;  Location: [facility];  Service: Gynecology;  Laterality: N/A;   INTRAUTERINE DEVICE INSERTION  07/05/2011   Mirena   MICRODISCECTOMY LUMBAR  02/04/2001   L4-5   SPINE SURGERY  2004   Cervical fusion    Family History  Problem Relation Age of Onset   Hyperlipidemia Mother    Hypertension Mother    Kidney cancer Mother    Depression Father    Atrial fibrillation Father    Kidney cancer Brother    Heart attack Brother 57   Depression Maternal Grandmother    Kidney cancer Maternal Grandmother    Heart attack Maternal Grandfather  Depression Paternal Grandmother    Melanoma Paternal Grandmother 16       mets to colon   Breast cancer Neg Hx     Allergies  Allergen Reactions   Tetanus Toxoids Other (See Comments)    Local Reaction    Current Medications:   Current Outpatient Medications:    buPROPion (WELLBUTRIN XL) 300 MG 24 hr tablet, TAKE 1 TABLET BY MOUTH  DAILY, Disp: 90 tablet, Rfl: 3   venlafaxine XR (EFFEXOR-XR) 150 MG 24 hr capsule, Take 150 mg by mouth daily., Disp: , Rfl:    Vitamin D, Cholecalciferol, 25 MCG (1000 UT) CAPS, Take 1 capsule by mouth daily in the afternoon., Disp: , Rfl:    ARIPiprazole (ABILIFY) 2 MG tablet, Take 2 mg by mouth daily. (Patient not taking: Reported on 02/03/2022), Disp: , Rfl:    Review of Systems:   ROS Negative unless otherwise specified per HPI.  Vitals:   Vitals:   02/03/22 1010  BP: 110/70  Pulse: (!) 57  Temp: 98 F (36.7 C)  TempSrc: Temporal  SpO2: 97%  Weight: 178 lb 6.1 oz (80.9  kg)  Height: '5\' 3"'$  (1.6 m)     Body mass index is 31.6 kg/m.  Physical Exam:   Physical Exam Vitals and nursing note reviewed.  Constitutional:      General: She is not in acute distress.    Appearance: She is well-developed. She is not ill-appearing or toxic-appearing.  Cardiovascular:     Rate and Rhythm: Normal rate and regular rhythm.     Pulses: Normal pulses.     Heart sounds: Normal heart sounds, S1 normal and S2 normal.  Pulmonary:     Effort: Pulmonary effort is normal.     Breath sounds: Normal breath sounds.  Skin:    General: Skin is warm and dry.  Neurological:     Mental Status: She is alert.     GCS: GCS eye subscore is 4. GCS verbal subscore is 5. GCS motor subscore is 6.  Psychiatric:        Speech: Speech normal.        Behavior: Behavior normal. Behavior is cooperative.     Assessment and Plan:   Depression, unspecified depression type Stable per patient Continue mgmt per psych Denies SI/HI  Elevated LDL cholesterol level We are going to obtain a calcium score for further evaluation Will defer medication start to these results Reviewed ASCVD of 1.2% today  Need for immunization against influenza Update today  Family history of cancer of the kidney Will reach out to genetics counselor to see if anything should be done for screening or risk prevention   Inda Coke, PA-C

## 2022-02-14 ENCOUNTER — Telehealth: Payer: Self-pay | Admitting: Genetic Counselor

## 2022-02-14 NOTE — Telephone Encounter (Signed)
Scheduled appt per 8/31 referral. Pt is aware of appt date and time. Pt is aware to arrive 15 mins prior to appt time and to bring and updated insurance card. Pt is aware of appt location.   

## 2022-02-22 ENCOUNTER — Encounter: Payer: Self-pay | Admitting: Physician Assistant

## 2022-03-09 ENCOUNTER — Ambulatory Visit (HOSPITAL_BASED_OUTPATIENT_CLINIC_OR_DEPARTMENT_OTHER)
Admission: RE | Admit: 2022-03-09 | Discharge: 2022-03-09 | Disposition: A | Discharge status: Home / Self Care | Payer: 59 | Source: Ambulatory Visit | Attending: Physician Assistant | Admitting: Physician Assistant

## 2022-03-09 DIAGNOSIS — E7800 Pure hypercholesterolemia, unspecified: Secondary | ICD-10-CM | POA: Insufficient documentation

## 2022-03-23 ENCOUNTER — Encounter: Payer: Self-pay | Admitting: Physician Assistant

## 2022-03-24 ENCOUNTER — Telehealth: Payer: 59 | Admitting: Physician Assistant

## 2022-03-24 DIAGNOSIS — R300 Dysuria: Secondary | ICD-10-CM

## 2022-03-24 MED ORDER — CEPHALEXIN 500 MG PO CAPS: 500.0000 mg | ORAL_CAPSULE | Freq: Two times a day (BID) | ORAL | 0 refills | Status: AC

## 2022-03-24 NOTE — Patient Instructions (Addendum)
  Heather Cochran, thank you for joining Rodney Booze, PA-C for today's virtual visit.  While this provider is not your primary care provider (PCP), if your PCP is located in our provider database this encounter information will be shared with them immediately following your visit.   [facility] account gives you access to today's visit and all your visits, tests, and labs performed at [facility] " click here if you don't have a [facility] account or go to [email or web address]  Consent: (Patient) Heather Cochran provided verbal consent for this virtual visit at the beginning of the encounter.  Current Medications:  Current Outpatient Medications:    ARIPiprazole (ABILIFY) 2 MG tablet, Take 2 mg by mouth daily. (Patient not taking: Reported on 02/03/2022), Disp: , Rfl:    buPROPion (WELLBUTRIN XL) 300 MG 24 hr tablet, TAKE 1 TABLET BY MOUTH  DAILY, Disp: 90 tablet, Rfl: 3   venlafaxine XR (EFFEXOR-XR) 150 MG 24 hr capsule, Take 150 mg by mouth daily., Disp: , Rfl:    Vitamin D, Cholecalciferol, 25 MCG (1000 UT) CAPS, Take 1 capsule by mouth daily in the afternoon., Disp: , Rfl:    Medications ordered in this encounter:  No orders of the defined types were placed in this encounter.    *If you need refills on other medications prior to your next appointment, please contact your pharmacy*  Follow-Up: Call back or seek an in-person evaluation if the symptoms worsen or if the condition fails to improve as anticipated.  [facility] [phone]  Other Instructions You were given a prescription for antibiotics. Please take the antibiotic prescription fully.   Follow up with your regular doctor in 1 week for reassessment and seek care sooner if your symptoms worsen or fail to improve.   If you have been instructed to have an in-person evaluation today at a local Urgent Care facility, please use the link below. It will take you to a list of all  of our available [facility], including address, phone number and hours of operation. Please do not delay care.  [facility]  If you or a family member do not have a primary care provider, use the link below to schedule a visit and establish care. When you choose a [facility] primary care physician or advanced practice provider, you gain a long-term partner in health. Find a Primary Care Provider  Learn more about [facility]'s in-office and virtual care options: [facility]

## 2022-03-24 NOTE — Progress Notes (Signed)
Virtual Visit Consent   Heather Cochran, you are scheduled for a virtual visit with a [facility] provider today. Just as with appointments in the office, your consent must be obtained to participate. Your consent will be active for this visit and any virtual visit you may have with one of our providers in the next 365 days. If you have a MyChart account, a copy of this consent can be sent to you electronically.  As this is a virtual visit, video technology does not allow for your provider to perform a traditional examination. This may limit your provider's ability to fully assess your condition. If your provider identifies any concerns that need to be evaluated in person or the need to arrange testing (such as labs, EKG, etc.), we will make arrangements to do so. Although advances in technology are sophisticated, we cannot ensure that it will always work on either your end or our end. If the connection with a video visit is poor, the visit may have to be switched to a telephone visit. With either a video or telephone visit, we are not always able to ensure that we have a secure connection.  By engaging in this virtual visit, you consent to the provision of healthcare and authorize for your insurance to be billed (if applicable) for the services provided during this visit. Depending on your insurance coverage, you may receive a charge related to this service.  I need to obtain your verbal consent now. Are you willing to proceed with your visit today? [name] has provided verbal consent on 03/24/2022 for a virtual visit (video or telephone). Rodney Booze, [place]  Date: 03/24/2022 10:44 AM  Virtual Visit via Video Note   I, [place], connected with  BENNY HENRIE  ([id number], [birth year]) on 03/24/22 at 10:30 AM EDT by a video-enabled telemedicine application and verified that I am speaking with the correct person using two identifiers.  Location: Patient: Virtual Visit Location Patient:  Home Provider: Virtual Visit Location Provider: Home   I discussed the limitations of evaluation and management by telemedicine and the availability of in person appointments. The patient expressed understanding and agreed to proceed.    History of Present Illness: Heather Cochran is a 56 y.o. who identifies as a female who was assigned female at birth, and is being seen today for uti.  States this am she started having dysuria, frequency, dark urine, malodorous urine. Denies fevers, chills, nausea, vomiting, abd pain, flank pain. Denies concern for STI.  Hx uti and this feels similar.  HPI: HPI  Problems:  Patient Active Problem List   Diagnosis Date Noted   Elevated LDL cholesterol level 10/30/2020   Vitamin D deficiency 10/30/2020   Depression 07/05/2013   Anxiety state, unspecified 07/05/2013   Fibroid 07/05/2013    Allergies:  Allergies  Allergen Reactions   Tetanus Toxoids Other (See Comments)    Local Reaction   Medications:  Current Outpatient Medications:    cephALEXin (KEFLEX) 500 MG capsule, Take 1 capsule (500 mg total) by mouth 2 (two) times daily for 7 days., Disp: 14 capsule, Rfl: 0   ARIPiprazole (ABILIFY) 2 MG tablet, Take 2 mg by mouth daily. (Patient not taking: Reported on 02/03/2022), Disp: , Rfl:    buPROPion (WELLBUTRIN XL) 300 MG 24 hr tablet, TAKE 1 TABLET BY MOUTH  DAILY, Disp: 90 tablet, Rfl: 3   venlafaxine XR (EFFEXOR-XR) 150 MG 24 hr capsule, Take 150 mg by mouth daily., Disp: ,  Rfl:    Vitamin D, Cholecalciferol, 25 MCG (1000 UT) CAPS, Take 1 capsule by mouth daily in the afternoon., Disp: , Rfl:   Observations/Objective: Patient is well-developed, well-nourished in no acute distress.  Resting comfortably at home.  Head is normocephalic, atraumatic.  No labored breathing.  Speech is clear and coherent with logical content.  Patient is alert and oriented at baseline.    Assessment and Plan: 1. Dysuria  Concern for UTI. Abx sent.   Follow Up  Instructions: I discussed the assessment and treatment plan with the patient. The patient was provided an opportunity to ask questions and all were answered. The patient agreed with the plan and demonstrated an understanding of the instructions.  A copy of instructions were sent to the patient via MyChart unless otherwise noted below.   The patient was advised to call back or seek an in-person evaluation if the symptoms worsen or if the condition fails to improve as anticipated.  Time:  I spent 12 minutes with the patient via telehealth technology discussing the above problems/concerns.    Jb Dulworth Gilman Schmidt, PA-C

## 2022-03-29 ENCOUNTER — Telehealth: Payer: Self-pay | Admitting: Genetic Counselor

## 2022-03-29 NOTE — Telephone Encounter (Signed)
Scheduled appointment per 10/24 scheduling message. Patient is aware of the changes made to her upcoming appointment.

## 2022-04-14 ENCOUNTER — Other Ambulatory Visit: Payer: 59

## 2022-04-14 ENCOUNTER — Encounter: Payer: 59 | Admitting: Genetic Counselor

## 2022-06-09 ENCOUNTER — Other Ambulatory Visit: Payer: Self-pay

## 2022-06-09 ENCOUNTER — Inpatient Hospital Stay: Payer: 59 | Admitting: Genetic Counselor

## 2022-06-09 ENCOUNTER — Inpatient Hospital Stay: Payer: 59

## 2022-06-09 ENCOUNTER — Encounter: Payer: Self-pay | Admitting: Genetic Counselor

## 2022-06-09 DIAGNOSIS — Z8051 Family history of malignant neoplasm of kidney: Secondary | ICD-10-CM | POA: Diagnosis not present

## 2022-06-09 DIAGNOSIS — Z800 Family history of malignant neoplasm of digestive organs: Secondary | ICD-10-CM | POA: Diagnosis not present

## 2022-06-09 DIAGNOSIS — Z808 Family history of malignant neoplasm of other organs or systems: Secondary | ICD-10-CM | POA: Diagnosis not present

## 2022-06-09 HISTORY — DX: Family history of malignant neoplasm of other organs or systems: Z80.8

## 2022-06-09 HISTORY — DX: Family history of malignant neoplasm of digestive organs: Z80.0

## 2022-06-09 HISTORY — DX: Family history of malignant neoplasm of kidney: Z80.51

## 2022-06-09 LAB — GENETIC SCREENING ORDER

## 2022-06-09 NOTE — Progress Notes (Signed)
REFERRING PROVIDER: Inda Coke, [address],  [place] [address]  PRIMARY PROVIDER:  Inda Coke, [place]  PRIMARY REASON FOR VISIT:  Encounter Diagnoses  Name Primary?   Family history of kidney cancer Yes   Family history of colon cancer    Family history of melanoma     HISTORY OF PRESENT ILLNESS:   Heather Cochran, a 57 y.o. female, was seen for a [place] cancer genetics consultation at the request of Inda Coke, [place] due to a family history of kidney cancer.  Heather Cochran presents to clinic today to discuss the possibility of a hereditary predisposition to cancer, to discuss genetic testing, and to further clarify her future cancer risks, as well as potential cancer risks for family members.   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  She follows up with dermatology annually.  No other personal history of cancer was reported.    RISK FACTORS:  Mammogram within the last year: yes; category b density  Number of breast biopsies: 0. Colonoscopy: yes;  most recent in 2016; no hx polyps Hysterectomy: yes; fibroid Ovaries intact: no.  Up to date with pelvic exams: yes. Menarche was at age 86 or 67.  First live birth at age 17.  Menopausal status: postmenopausal.  OCP use for approximately  25  years.  HRT use: 0 years.   Past Medical History:  Diagnosis Date   Allergy    Anxiety    Depression    Encounter for insertion of mirena IUD 06/29/2011   GERD (gastroesophageal reflux disease)    Hyperlipidemia    Stress fracture of ankle 07/2018   right     Past Surgical History:  Procedure Laterality Date   CERVICAL DISC ARTHROPLASTY  06/06/2008   CESAREAN SECTION  06/06/1994   HYSTEROSCOPY N/A 07/04/2016   Procedure: HYSTEROSCOPY with removal of IUD;  Surgeon: Megan Salon, MD;  Location: [facility];  Service: Gynecology;  Laterality: N/A;   INTRAUTERINE DEVICE (IUD) INSERTION N/A 07/04/2016   Procedure:  INTRAUTERINE DEVICE (IUD) INSERTION;  Surgeon: Megan Salon, MD;  Location: [facility];  Service: Gynecology;  Laterality: N/A;   INTRAUTERINE DEVICE INSERTION  07/05/2011   Mirena   MICRODISCECTOMY LUMBAR  02/04/2001   L4-5   SPINE SURGERY  2004   Cervical fusion    FAMILY HISTORY:  We obtained a detailed, 4-generation family history.  Significant diagnoses are listed below: Family History  Problem Relation Age of Onset   Kidney cancer Mother 24       separate lung primary?   Kidney cancer Brother 31   Heart attack Brother 44   Non-Hodgkin's lymphoma Maternal Uncle        dx 45s   Kidney cancer Maternal Grandmother        dx 30s-40s   Cancer Maternal Grandmother        unknown type in 33s   Melanoma Paternal Grandmother        dx after 29; mets   Colon cancer Cousin        dx early 35s; mat female cousin     Heather Cochran is unaware of previous family history of genetic testing for hereditary cancer risks. Affected relatives are unavailable for genetic testing at this time. Patient's maternal ancestors are of [place] descent, and paternal ancestors are of [place] descent. There is reported Ashkenazi Jewish ancestry. There is no known consanguinity.  GENETIC COUNSELING ASSESSMENT: Heather Cochran  is a 57 y.o. female with a family history which is somewhat suggestive of a hereditary cancer syndrome and predisposition to cancer given the presence of kidney cancer at young ages in three generations. We, therefore, discussed and recommended the following at today's visit.   DISCUSSION: We discussed that 5 - 10% of cancer is hereditary.  Hereditary kidney cancer is associated with several genes including but not limited to VHL, MET, FLCN, TSC1/2, FH, and BAP1.  We also reviewed that given her Ashkenazi Jewish ancestry, she has an increased risk of other hereditary cancer mutations, such as those in BRCA1/2. We discussed that testing is beneficial for several reasons, including knowing about other  cancer risks, identifying potential screening and risk-reduction options that may be appropriate, and to understanding if other family members could be at risk for cancer and allowing them to undergo genetic testing.  We reviewed the characteristics, features and inheritance patterns of hereditary cancer syndromes. We also discussed genetic testing, including the appropriate family members to test, the process of testing, insurance coverage and turn-around-time for results. We discussed the implications of a negative, positive, and variant of uncertain significant result. We discussed that negative results would be uninformative given that Heather Cochran does not have a personal history of cancer. We recommended Heather Cochran pursue genetic testing for a panel that contains genes associated with kidney cancer, melanoma, and other cancers.  The Multi-Cancer + RNA Panel offered by [facility] includes sequencing and/or deletion/duplication analysis of the following 70 genes:  AIP*, ALK, APC*, ATM*, AXIN2*, BAP1*, BARD1*, BLM*, BMPR1A*, BRCA1*, BRCA2*, BRIP1*, CDC73*, CDH1*, CDK4, CDKN1B*, CDKN2A, CHEK2*, CTNNA1*, DICER1*, EPCAM (del/dup only), EGFR, FH*, FLCN*, GREM1 (promoter dup only), HOXB13, KIT, LZTR1, MAX*, MBD4, MEN1*, MET, MITF, MLH1*, MSH2*, MSH3*, MSH6*, MUTYH*, NF1*, NF2*, NTHL1*, PALB2*, PDGFRA, PMS2*, POLD1*, POLE*, POT1*, PRKAR1A*, PTCH1*, PTEN*, RAD51C*, RAD51D*, RB1*, RET, SDHA* (sequencing only), SDHAF2*, SDHB*, SDHC*, SDHD*, SMAD4*, SMARCA4*, SMARCB1*, SMARCE1*, STK11*, SUFU*, TMEM127*, TP53*, TSC1*, TSC2*, VHL*. RNA analysis is performed for * genes.  Based on Heather Cochran's family history of kidney cancer, she meets medical criteria for genetic testing. Despite that she meets criteria, she may still have an out of pocket cost. We discussed that if her out of pocket cost for testing is over $100, the laboratory should contact them to discuss self-pay options and/or patient pay assistance programs.   We  discussed the Genetic Information Non-Discrimination Act (GINA) of 2008, which helps protect individuals against genetic discrimination based on their genetic test results.  It impacts both health insurance and employment.  With health insurance, it protects against genetic test results being used for increased premiums or policy termination. For employment, it protects against hiring, firing and promoting decisions based on genetic test results.  GINA does not apply to those in the [employer], those who work for companies with less than 15 employees, and new life insurance or long-term disability insurance policies.  Health status due to a cancer diagnosis is not protected under GINA.  PLAN: After considering the risks, benefits, and limitations, Heather Cochran provided informed consent to pursue genetic testing and the blood sample was sent to [facility] for analysis of the Multi-Cancer +RNA. Results should be available within approximately 3 weeks' time, at which point they will be disclosed by telephone to Heather Cochran, as will any additional recommendations warranted by these results. Heather Cochran will receive a summary of her genetic counseling visit and a copy of her results once available. This information will also be available in  Epic.   Based on Heather Cochran's family history, we recommended her mother and brother, who was diagnosed with kidney cancer, have genetic counseling and testing. Heather Cochran can let us know if we can be of any assistance in coordinating genetic counseling and/or testing for these family members.   Heather Cochran questions were answered to her satisfaction today. Our contact information was provided should additional questions or concerns arise. Thank you for the referral and allowing [place] to share in the care of your patient.   Kayleb Warshaw M. Joette Catching, [place], [facility] Genetic Counselor [email or web address] (P) [phone]   The patient was seen for a total of 30 minutes in  face-to-face genetic counseling.  The patient was seen alone.  Drs. Lindi Adie and/or Burr Medico were available to discuss this case as needed.  _______________________________________________________________________ For Office Staff:  Number of people involved in session: 1 Was an Intern/ student involved with case: no

## 2022-06-20 ENCOUNTER — Encounter: Payer: Self-pay | Admitting: Genetic Counselor

## 2022-06-20 ENCOUNTER — Telehealth: Payer: Self-pay | Admitting: Genetic Counselor

## 2022-06-20 DIAGNOSIS — Z1509 Genetic susceptibility to other malignant neoplasm: Secondary | ICD-10-CM | POA: Insufficient documentation

## 2022-06-20 DIAGNOSIS — Z1379 Encounter for other screening for genetic and chromosomal anomalies: Secondary | ICD-10-CM | POA: Insufficient documentation

## 2022-06-20 NOTE — Telephone Encounter (Signed)
Contacted patient in attempt to disclose results of genetic testing.  LVM with contact information requesting a call back.

## 2022-06-24 ENCOUNTER — Ambulatory Visit: Payer: Self-pay | Admitting: Genetic Counselor

## 2022-06-24 DIAGNOSIS — Z808 Family history of malignant neoplasm of other organs or systems: Secondary | ICD-10-CM

## 2022-06-24 DIAGNOSIS — Z8051 Family history of malignant neoplasm of kidney: Secondary | ICD-10-CM

## 2022-06-24 DIAGNOSIS — Z1509 Genetic susceptibility to other malignant neoplasm: Secondary | ICD-10-CM

## 2022-06-24 DIAGNOSIS — Z800 Family history of malignant neoplasm of digestive organs: Secondary | ICD-10-CM

## 2022-06-24 DIAGNOSIS — Z1379 Encounter for other screening for genetic and chromosomal anomalies: Secondary | ICD-10-CM

## 2022-06-24 NOTE — Telephone Encounter (Addendum)
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  Discussed 5-10% lifetime risk for colon cancer and having colonoscopies at least every 5 years.  Recommended genetic testing for family members for APC gene.  Also recommended family members have GT for kidney cancer-related cancers.  Offered referral to Urology based on [place] kidney cancer and patient declined at this time.

## 2022-06-24 NOTE — Progress Notes (Signed)
GENETIC TEST RESULTS  Patient Name: Heather Cochran Patient Age: 57 y.o. Encounter Date: 06/24/2022  Referring Provider: Inda Coke, PA      Heather Cochran was seen in the [place] clinic on June 09, 2022 due to a family history of kidney cancer and concern regarding a hereditary predisposition to cancer in the family. Please refer to the prior Genetics clinic note for more information regarding Heather Cochran's medical and family histories and our assessment at the time.    FAMILY HISTORY:  We obtained a detailed, 4-generation family history.  Significant diagnoses are listed below:      Family History  Problem Relation Age of Onset   Kidney cancer Mother 42        separate lung primary?   Kidney cancer Brother 15   Heart attack Brother 61   Non-Hodgkin's lymphoma Maternal Uncle          dx 31s   Kidney cancer Maternal Grandmother          dx 30s-40s   Cancer Maternal Grandmother          unknown type in 30s   Melanoma Paternal Grandmother          dx after 55; mets   Colon cancer Cousin          dx early 43s; mat female cousin       Heather Cochran is unaware of previous family history of genetic testing for hereditary cancer risks.   Patient's maternal ancestors are of [place] descent, and paternal ancestors are of [place] descent. There is reported Ashkenazi Jewish ancestry in the paternal and maternal family. There is no known consanguinity.     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   The APC I1307K increased risk allele is a well-described moderate-risk variant that is not associated with Familial Adenomatous Polyposis but does confer a moderately-increased lifetime risk for colorectal cancer.  No other deleterious variants were detected in the [facility] Multi-Cancer +RNA Panel.    The Multi-Cancer + RNA Panel offered by [facility] includes sequencing and/or deletion/duplication analysis of the following 70  genes:  AIP*, ALK, APC*, ATM*, AXIN2*, BAP1*, BARD1*, BLM*, BMPR1A*, BRCA1*, BRCA2*, BRIP1*, CDC73*, CDH1*, CDK4, CDKN1B*, CDKN2A, CHEK2*, CTNNA1*, DICER1*, EPCAM (del/dup only), EGFR, FH*, FLCN*, GREM1 (promoter dup only), HOXB13, KIT, LZTR1, MAX*, MBD4, MEN1*, MET, MITF, MLH1*, MSH2*, MSH3*, MSH6*, MUTYH*, NF1*, NF2*, NTHL1*, PALB2*, PDGFRA, PMS2*, POLD1*, POLE*, POT1*, PRKAR1A*, PTCH1*, PTEN*, RAD51C*, RAD51D*, RB1*, RET, SDHA* (sequencing only), SDHAF2*, SDHB*, SDHC*, SDHD*, SMAD4*, SMARCA4*, SMARCB1*, SMARCE1*, STK11*, SUFU*, TMEM127*, TP53*, TSC1*, TSC2*, VHL*. RNA analysis is performed for * genes.   The test report will be scanned into EPIC and located under the Molecular Pathology section of the Results Review tab.  A portion of the result report is included below for reference. The report date is June 16, 2022.       Of note, this APC does not explain the family history of kidney cancer at this point in time.  We discussed with Heather Cochran that because current genetic testing is not perfect, it is possible there may be a gene mutation in one of these genes that current testing cannot detect, but that chance is small.  We also discussed, that there could be another gene that has not yet been discovered, or that we have not yet tested, that is responsible for the cancer diagnoses in the family. It is also possible  there is a hereditary cause for the cancer in the family that Heather Cochran did not inherit and therefore was not identified in her testing.  Therefore, it is important to remain in touch with cancer genetics in the future so that we can continue to offer Heather Cochran the most up to date genetic testing.   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 Approximately 6-8% of the Ashkenazi Jewish population are carriers of this variant, although individuals of non-Jewish ancestry can also have this mutation.    Importantly, the I1307K variant is different than other mutations in the APC gene. Most mutations in the APC gene cause a rare genetic condition called Familial Adenomatous Polyposis (FAP), which is characterized by a very high risk for colorectal cancer (up to 100%) and the development of hundreds to thousands of colon polyps. The APC I1307K mutation does not cause FAP.  Cancer Risks for the p.I1307K variant in the APC gene: Colon cancer, 5-10% risk  This information is based on current understanding of the gene and may change in the future.  Management Recommendations: Colonoscopy screening every 5 years beginning at age 4 If an individual has a first-degree relative with colorectal cancer, screening should begin 10 years prior to the relative's age at diagnosis or at age 54, whichever comes first.  Implications for Family Members: Hereditary predisposition to cancer due to pathogenic variants in the APC gene has autosomal dominant inheritance. This means that an individual with a pathogenic variant has a 50% chance of passing the condition on to his/her offspring.  First degree relatives (siblings, parents, children) have a 50% chance of having the same variant.  More distant relatives also have an increased chance of having this variant in APC.  Identification of a pathogenic variant allows for the recognition of at-risk relatives who can pursue testing for the familial variant.  Family members are encouraged to consider genetic testing for this familial pathogenic variant. As there are generally no childhood cancer risks associated with pathogenic variants in the APC gene. I1307K pathogenic variant, individuals in the family are not recommended to have testing until they reach at least 56 years of age. They may contact our office at [phone] for more information or to schedule an appointment.  Complimentary testing for the familial variant is available for 150 days from the report  date--June 16, 2022.  Family members who live outside of the area are encouraged to find a genetic counselor in their area by visiting: [email or web address].  Family history of kidney cancer:  No deleterious variants were detected in genes known to be associated with kidney cancer at this time. We discussed that she has an elevated risk (compared to the general population) for kidney cancer given the maternal family history of kidney cancer in three generations.  At this time, NCCN does not list specific guidelines related to those with a family history of kidney cancer in the absence of a known family variant.  Per NCCN, screening should be individualized based on personal and family history of kidney cancer.  We offered Ms. Tuder's a referral to urology to discuss appropriate screening based on her family history.  She is not overly concerned about the family history of kidney cancer at this time but knows to reach out in the future if she is interested in a referral.  We recommended that appropriate family members have multi-cancer gene panel that includes kidney cancer  genes.  We recommend Ms. Bonnie contact us with results of family members (especially her mother and brother), as these results would help interpret her negative result.   PLAN:  Ms. Rolland Porter most recent colonoscopy was in 2016.  She has previously seen Dr. Juanita Craver for her GI care.  Dr. Collene Mares notified.  She is encouraged to set up an appointment for a colonoscopy given her increased risk allele in the APC gene.  Family sharing letter provided to encourage family testing.  Her children live in [place].  The rest of her family lives in [place] and [place].  Ms. Rolland Porter declined referral to urology at this time but knows to reach out to herself or her PCP if she is interested in the future.    We recommended that Ms. Ruddick follow up with the genetics clinic regularly so we can provide her with the most current  information about APC I1307K and cancer risk, as well as with any changes to her family history (e.g. new cancer diagnoses, genetic test results).  Our contact number was provided. Ms. Soller questions were answered to her satisfaction, and she knows she is welcome to call us at anytime with additional questions or concerns.      Atticus Wedin M. Joette Catching, [place], [facility] Genetic Counselor [email or web address] (P) [phone]

## 2022-06-27 ENCOUNTER — Encounter: Payer: Self-pay | Admitting: Genetic Counselor

## 2022-08-04 ENCOUNTER — Other Ambulatory Visit: Payer: Self-pay | Admitting: Obstetrics & Gynecology

## 2022-08-04 DIAGNOSIS — Z1231 Encounter for screening mammogram for malignant neoplasm of breast: Secondary | ICD-10-CM

## 2022-09-05 ENCOUNTER — Encounter: Payer: Self-pay | Admitting: Physician Assistant

## 2022-09-09 ENCOUNTER — Ambulatory Visit: Payer: 59 | Admitting: Physician Assistant

## 2022-09-15 ENCOUNTER — Ambulatory Visit
Admission: RE | Admit: 2022-09-15 | Discharge: 2022-09-15 | Disposition: A | Discharge status: Home / Self Care | Payer: 59 | Source: Ambulatory Visit | Attending: Obstetrics & Gynecology | Admitting: Obstetrics & Gynecology

## 2022-09-15 DIAGNOSIS — Z1231 Encounter for screening mammogram for malignant neoplasm of breast: Secondary | ICD-10-CM

## 2022-12-29 ENCOUNTER — Ambulatory Visit (INDEPENDENT_AMBULATORY_CARE_PROVIDER_SITE_OTHER): Payer: 59 | Admitting: Obstetrics & Gynecology

## 2022-12-29 ENCOUNTER — Encounter (HOSPITAL_BASED_OUTPATIENT_CLINIC_OR_DEPARTMENT_OTHER): Payer: Self-pay | Admitting: Obstetrics & Gynecology

## 2022-12-29 VITALS — BP 132/68 | HR 68 | Ht 63.0 in | Wt 190.8 lb

## 2022-12-29 DIAGNOSIS — Z01419 Encounter for gynecological examination (general) (routine) without abnormal findings: Secondary | ICD-10-CM

## 2022-12-29 DIAGNOSIS — Z808 Family history of malignant neoplasm of other organs or systems: Secondary | ICD-10-CM

## 2022-12-29 DIAGNOSIS — Z8051 Family history of malignant neoplasm of kidney: Secondary | ICD-10-CM

## 2022-12-29 DIAGNOSIS — Z800 Family history of malignant neoplasm of digestive organs: Secondary | ICD-10-CM

## 2022-12-29 NOTE — Progress Notes (Signed)
57 y.o. [id number] [name] here for annual exam.  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  Colonoscopy screening recommended every 5 years.  Last colonoscopy was 2016.  She is going to call for appt.  Denies vaginal bleeding.    Patient's last menstrual period was 07/05/2011.          Sexually active: Yes.    The current method of family planning is post menopausal status.    Exercising: not much Smoker:  no  Health Maintenance: Pap:  10/29/2020 Negative History of abnormal Pap:  no MMG:  09/15/2022 Negative Colonoscopy:  10/31/2014, Dr. Loreta Ave  BMD:   guidelines reviewed Screening Labs: 12/2022   reports that she quit smoking about 30 years ago. Her smoking use included cigarettes. She started smoking about 35 years ago. She has a 5 pack-year smoking history. She has never used smokeless tobacco. She reports current alcohol use. She reports that she does not use drugs.  Past Medical History:  Diagnosis Date   Allergy    Anxiety    Depression    Encounter for insertion of mirena IUD 06/29/2011   Family history of colon cancer 06/09/2022   Family history of kidney cancer 06/09/2022   Family history of melanoma 06/09/2022   GERD (gastroesophageal reflux disease)    Hyperlipidemia    Stress fracture of ankle 07/2018   right     Past Surgical History:  Procedure Laterality Date   CERVICAL DISC ARTHROPLASTY  06/06/2008   CESAREAN SECTION  06/06/1994   HYSTEROSCOPY N/A 07/04/2016   Procedure: HYSTEROSCOPY with removal of IUD;  Surgeon: Jerene Bears, MD;  Location: [facility];  Service: Gynecology;  Laterality: N/A;   INTRAUTERINE DEVICE (IUD) INSERTION N/A 07/04/2016   Procedure: INTRAUTERINE DEVICE (IUD) INSERTION;  Surgeon: Jerene Bears, MD;  Location: [facility];  Service: Gynecology;  Laterality: N/A;   INTRAUTERINE DEVICE INSERTION  07/05/2011   Mirena   MICRODISCECTOMY LUMBAR  02/04/2001   L4-5   SPINE SURGERY  2004   Cervical fusion    Current  Outpatient Medications  Medication Sig Dispense Refill   ARIPiprazole (ABILIFY) 2 MG tablet Take 2 mg by mouth daily.     buPROPion (WELLBUTRIN XL) 300 MG 24 hr tablet TAKE 1 TABLET BY MOUTH  DAILY 90 tablet 3   venlafaxine XR (EFFEXOR-XR) 150 MG 24 hr capsule Take 150 mg by mouth daily.     Vitamin D, Cholecalciferol, 25 MCG (1000 UT) CAPS Take 1 capsule by mouth daily in the afternoon.     No current facility-administered medications for this visit.    Family History  Problem Relation Age of Onset   Hyperlipidemia Mother    Hypertension Mother    Kidney cancer Mother 34       separate lung primary?   Depression Father    Atrial fibrillation Father    Kidney cancer Brother 18   Heart attack Brother 79   Non-Hodgkin's lymphoma Maternal Uncle        dx 1s   Depression Maternal Grandmother    Kidney cancer Maternal Grandmother        dx 30s-40s   Cancer Maternal Grandmother        unknown type in 3s   Heart attack Maternal Grandfather    Depression Paternal Grandmother    Melanoma Paternal Grandmother        dx after 38; mets   Colon cancer Cousin        dx  early 26s; mat female cousin   Breast cancer Neg Hx     ROS: Constitutional: negative Genitourinary:negative  Exam:   BP 132/68 (BP Location: Left Arm, Patient Position: Sitting, Cuff Size: Large)   Pulse 68   Ht 5\' 3"  (1.6 m) Comment: REported  Wt 190 lb 12.8 oz (86.5 kg)   LMP 07/05/2011   BMI 33.80 kg/m   Height: 5\' 3"  (160 cm) (REported)  General appearance: alert, cooperative and appears stated age Head: Normocephalic, without obvious abnormality, atraumatic Neck: no adenopathy, supple, symmetrical, trachea midline and thyroid normal to inspection and palpation Lungs: clear to auscultation bilaterally Breasts: normal appearance, no masses or tenderness Heart: regular rate and rhythm Abdomen: soft, non-tender; bowel sounds normal; no masses,  no organomegaly Extremities: extremities normal, atraumatic,  no cyanosis or edema Skin: Skin color, texture, turgor normal. No rashes or lesions Lymph nodes: Cervical, supraclavicular, and axillary nodes normal. No abnormal inguinal nodes palpated Neurologic: Grossly normal  Pelvic: External genitalia:  no lesions              Urethra:  normal appearing urethra with no masses, tenderness or lesions              Bartholins and Skenes: normal                 Vagina: normal appearing vagina with normal color and no discharge, no lesions              Cervix: no lesions              Pap taken: No. Bimanual Exam:  Uterus:  normal size, contour, position, consistency, mobility, non-tender              Adnexa: normal adnexa and no mass, fullness, tenderness               Rectovaginal: Confirms               Anus:  normal sphincter tone, no lesions  Chaperone, Ina Homes, CMA, was present for exam.  Assessment/Plan: 1. Well woman exam with routine gynecological exam - Pap smear 2022 with neg HR HPV.  Repeat next year. - Mammogram 09/15/2022 - Colonoscopy 10/31/2014 - Bone mineral density guidelines reviewed - lab work done with PCP - vaccines reviewed/updated  2. Family history of colon cancer - has increased genetic risk for colon cancer.  Follow up due now.  She is aware.  3. Family history of kidney cancer  4. Family history of melanoma

## 2023-09-27 ENCOUNTER — Other Ambulatory Visit: Payer: Self-pay | Admitting: Physician Assistant

## 2023-09-27 DIAGNOSIS — Z1231 Encounter for screening mammogram for malignant neoplasm of breast: Secondary | ICD-10-CM

## 2023-10-13 ENCOUNTER — Ambulatory Visit
Admission: RE | Admit: 2023-10-13 | Discharge: 2023-10-13 | Disposition: A | Discharge status: Home / Self Care | Source: Ambulatory Visit | Attending: Physician Assistant | Admitting: Physician Assistant

## 2023-10-13 DIAGNOSIS — Z1231 Encounter for screening mammogram for malignant neoplasm of breast: Secondary | ICD-10-CM

## 2024-01-31 ENCOUNTER — Ambulatory Visit (HOSPITAL_BASED_OUTPATIENT_CLINIC_OR_DEPARTMENT_OTHER): Admitting: Certified Nurse Midwife

## 2024-01-31 ENCOUNTER — Encounter (HOSPITAL_BASED_OUTPATIENT_CLINIC_OR_DEPARTMENT_OTHER): Payer: Self-pay | Admitting: Certified Nurse Midwife

## 2024-01-31 ENCOUNTER — Other Ambulatory Visit (HOSPITAL_COMMUNITY)
Admission: RE | Admit: 2024-01-31 | Discharge: 2024-01-31 | Disposition: A | Discharge status: Home / Self Care | Source: Ambulatory Visit | Attending: Certified Nurse Midwife | Admitting: Certified Nurse Midwife

## 2024-01-31 ENCOUNTER — Ambulatory Visit (HOSPITAL_BASED_OUTPATIENT_CLINIC_OR_DEPARTMENT_OTHER): Admitting: Obstetrics & Gynecology

## 2024-01-31 VITALS — BP 121/61 | HR 69 | Ht 64.0 in | Wt 193.2 lb

## 2024-01-31 DIAGNOSIS — T753XXS Motion sickness, sequela: Secondary | ICD-10-CM

## 2024-01-31 DIAGNOSIS — Z1151 Encounter for screening for human papillomavirus (HPV): Secondary | ICD-10-CM | POA: Diagnosis not present

## 2024-01-31 DIAGNOSIS — Z01419 Encounter for gynecological examination (general) (routine) without abnormal findings: Secondary | ICD-10-CM | POA: Diagnosis not present

## 2024-01-31 DIAGNOSIS — Z124 Encounter for screening for malignant neoplasm of cervix: Secondary | ICD-10-CM

## 2024-01-31 MED ORDER — SCOPOLAMINE 1 MG/3DAYS TD PT72: 1.0000 | MEDICATED_PATCH | TRANSDERMAL | 3 refills | Status: AC

## 2024-01-31 NOTE — Progress Notes (Signed)
 58 y.o. [id number] [name] here for annual exam. She lives with her supportive spouse. They are both retiring soon. She is retiring in September. They have their 1st cruise planned in October. She has 2 daughters. Her first grandchild (grandson) is due this year. They live nearby.   Pt denies vaginal spotting or bleeding. Sexually active and denies c/o. Denies urinary complaints. Pt states Colonoscopy due 2026 (normal in 2016).   Patient's last menstrual period was 07/05/2011.          Sexually active: Yes.    Exercising: Yes.    [employer] Smoker:  no  Health Maintenance: Pap:  10/29/2020 Negative History of abnormal Pap:  no MMG:  10/13/2023 Negative Colonoscopy:  10/31/2014 BMD:   Not yet Screening Labs: Ordered   reports that she quit smoking about 31 years ago. Her smoking use included cigarettes. She started smoking about 36 years ago. She has a 5 pack-year smoking history. She has never used smokeless tobacco. She reports current alcohol use. She reports that she does not use drugs.  Past Medical History:  Diagnosis Date   Allergy    Anxiety    Depression    Encounter for insertion of Mirena IUD 06/29/2011   Family history of colon cancer 06/09/2022   Family history of kidney cancer 06/09/2022   Family history of melanoma 06/09/2022   GERD (gastroesophageal reflux disease)    Hyperlipidemia    Stress fracture of ankle 07/2018   right     Past Surgical History:  Procedure Laterality Date   CERVICAL DISC ARTHROPLASTY  06/06/2008   CESAREAN SECTION  06/06/1994   HYSTEROSCOPY N/A 07/04/2016   Procedure: HYSTEROSCOPY with removal of IUD;  Surgeon: Ronal GORMAN Pinal, MD;  Location: [facility];  Service: Gynecology;  Laterality: N/A;   INTRAUTERINE DEVICE (IUD) INSERTION N/A 07/04/2016   Procedure: INTRAUTERINE DEVICE (IUD) INSERTION;  Surgeon: Ronal GORMAN Pinal, MD;  Location: [facility];  Service: Gynecology;  Laterality: N/A;   INTRAUTERINE DEVICE INSERTION   07/05/2011   Mirena   MICRODISCECTOMY LUMBAR  02/04/2001   L4-5   SPINE SURGERY  2004   Cervical fusion    Current Outpatient Medications  Medication Sig Dispense Refill   ARIPiprazole (ABILIFY) 2 MG tablet Take 2 mg by mouth daily.     buPROPion  (WELLBUTRIN  XL) 300 MG 24 hr tablet TAKE 1 TABLET BY MOUTH  DAILY 90 tablet 3   venlafaxine  XR (EFFEXOR -XR) 150 MG 24 hr capsule Take 150 mg by mouth daily.     Vitamin D , Cholecalciferol, 25 MCG (1000 UT) CAPS Take 1 capsule by mouth daily in the afternoon.     No current facility-administered medications for this visit.    Family History  Problem Relation Age of Onset   Hyperlipidemia Mother    Hypertension Mother    Kidney cancer Mother 52       separate lung primary?   Depression Father    Atrial fibrillation Father    Non-Hodgkin's lymphoma Maternal Uncle        dx 70s   Depression Maternal Grandmother    Kidney cancer Maternal Grandmother        dx 30s-40s   Cancer Maternal Grandmother        unknown type in 73s   Heart attack Maternal Grandfather    Depression Paternal Grandmother    Melanoma Paternal Grandmother        dx after 29; mets   Colon cancer Cousin  dx early 65s; mat female cousin   Kidney cancer Brother 50   Heart attack Brother 67   Breast cancer Neg Hx    BRCA 1/2 Neg Hx     ROS: Constitutional: negative Genitourinary:negative  Exam:   BP 121/61   Pulse 69   Ht 5' 4 (1.626 m) Comment: Reported  Wt 193 lb 3.2 oz (87.6 kg)   LMP 07/05/2011   BMI 33.16 kg/m   Height: 5' 4 (162.6 cm) (Reported)  General appearance: alert, cooperative and appears stated age Head: Normocephalic, without obvious abnormality, atraumatic Lungs: clear to auscultation bilaterally Breasts: normal appearance, no masses or tenderness, Inspection negative, No nipple retraction or dimpling, No nipple discharge or bleeding, No axillary or supraclavicular adenopathy, Normal to palpation without dominant masses Heart:  regular rate and rhythm Abdomen: soft, non-tender; bowel sounds normal; no masses,  no organomegaly Extremities: extremities normal, atraumatic, no cyanosis or edema Skin: Skin color, texture, turgor normal. No rashes or lesions Lymph nodes: Cervical, supraclavicular, and axillary nodes normal. No abnormal inguinal nodes palpated Neurologic: Grossly normal   Pelvic: External genitalia:  no lesions              Urethra:  normal appearing urethra with no masses, tenderness or lesions              Bartholins and Skenes: normal                 Vagina: normal appearing vagina with normal color and no discharge, no lesions              Cervix: no bleeding following Pap, no cervical motion tenderness, and no lesions              Pap taken: Yes.   Bimanual Exam:  Uterus:  normal size, contour, position, consistency, mobility, non-tender              Adnexa: normal adnexa and no mass, fullness, tenderness               Rectovaginal: Confirms               Anus:  normal sphincter tone, no lesions  Chaperone,  CMA, was present for exam.  Assessment/Plan:  1. Encounter for annual routine gynecological examination (Primary) - Breast self awareness and annual screening mammograms encouraged - Continue to follow with Dermatology for Skin Cancer Screenings - Pt plans Colonoscopy in 2026 - Shingles Vaccine completed - Pt allergic to Tetanus and cannot receive Tdap  - Lipid Panel With LDL/HDL Ratio - CBC - Comp Met (CMET) - TSH - Hemoglobin A1c - VITAMIN D  25 Hydroxy (Vit-D Deficiency, Fractures)  2. Cervical cancer screening - Cytology - PAP( [facility])  3. Motion sickness, sequela - 1st cruise October. - scopolamine  (TRANSDERM-SCOP) 1 MG/3DAYS; Place 1 patch (1 mg total) onto the skin every 3 (three) days. Apply patch on day before the cruise departs. Change patch every 72 hours  Dispense: 10 patch; Refill: 3   RTO 1 year for annual gyn exam and prn if issues arise. Heather Cochran

## 2024-02-01 ENCOUNTER — Ambulatory Visit (HOSPITAL_BASED_OUTPATIENT_CLINIC_OR_DEPARTMENT_OTHER): Payer: Self-pay | Admitting: Certified Nurse Midwife

## 2024-02-01 DIAGNOSIS — R7989 Other specified abnormal findings of blood chemistry: Secondary | ICD-10-CM

## 2024-02-01 LAB — CBC
Hematocrit: 44.8 % (ref 34.0–46.6)
Hemoglobin: 14.3 g/dL (ref 11.1–15.9)
MCH: 28.9 pg (ref 26.6–33.0)
MCHC: 31.9 g/dL (ref 31.5–35.7)
MCV: 91 fL (ref 79–97)
Platelets: 385 x10E3/uL (ref 150–450)
RBC: 4.95 x10E6/uL (ref 3.77–5.28)
RDW: 12.6 % (ref 11.7–15.4)
WBC: 6.9 x10E3/uL (ref 3.4–10.8)

## 2024-02-01 LAB — LIPID PANEL WITH LDL/HDL RATIO
Cholesterol, Total: 255 mg/dL — ABNORMAL HIGH (ref 100–199)
HDL: 77 mg/dL (ref >39)
LDL Chol Calc (NIH): 157 mg/dL — ABNORMAL HIGH (ref 0–99)
LDL/HDL Ratio: 2 ratio (ref 0.0–3.2)
Triglycerides: 122 mg/dL (ref 0–149)
VLDL Cholesterol Cal: 21 mg/dL (ref 5–40)

## 2024-02-01 LAB — COMPREHENSIVE METABOLIC PANEL WITH GFR
ALT: 11 IU/L (ref 0–32)
AST: 18 IU/L (ref 0–40)
Albumin: 4.6 g/dL (ref 3.8–4.9)
Alkaline Phosphatase: 81 IU/L (ref 44–121)
BUN/Creatinine Ratio: 14 (ref 9–23)
BUN: 13 mg/dL (ref 6–24)
Bilirubin Total: 0.5 mg/dL (ref 0.0–1.2)
CO2: 23 mmol/L (ref 20–29)
Calcium: 10.3 mg/dL — ABNORMAL HIGH (ref 8.7–10.2)
Chloride: 102 mmol/L (ref 96–106)
Creatinine, Ser: 0.93 mg/dL (ref 0.57–1.00)
Globulin, Total: 2.7 g/dL (ref 1.5–4.5)
Glucose: 84 mg/dL (ref 70–99)
Potassium: 5.4 mmol/L — ABNORMAL HIGH (ref 3.5–5.2)
Sodium: 140 mmol/L (ref 134–144)
Total Protein: 7.3 g/dL (ref 6.0–8.5)
eGFR: 72 mL/min/1.73 (ref >59)

## 2024-02-01 LAB — HEMOGLOBIN A1C
Est. average glucose Bld gHb Est-mCnc: 103 mg/dL
Hgb A1c MFr Bld: 5.2 % (ref 4.8–5.6)

## 2024-02-01 LAB — TSH: TSH: 1.67 u[IU]/mL (ref 0.450–4.500)

## 2024-02-01 LAB — VITAMIN D 25 HYDROXY (VIT D DEFICIENCY, FRACTURES): Vit D, 25-Hydroxy: 31.7 ng/mL (ref 30.0–100.0)

## 2024-02-01 MED ORDER — VITAMIN D (ERGOCALCIFEROL) 1.25 MG (50000 UNIT) PO CAPS: 50000.0000 [IU] | ORAL_CAPSULE | ORAL | 4 refills | Status: AC

## 2024-02-02 LAB — CYTOLOGY - PAP
Comment: NEGATIVE
Diagnosis: NEGATIVE
High risk HPV: NEGATIVE
